# Patient Record
Sex: Male | Born: 1940
Health system: Southern US, Community
[De-identification: ages and names within clinical notes are randomized; demographics above are authoritative.]

## PROBLEM LIST (undated history)

## (undated) DIAGNOSIS — E7800 Pure hypercholesterolemia, unspecified: Secondary | ICD-10-CM

## (undated) DIAGNOSIS — K219 Gastro-esophageal reflux disease without esophagitis: Secondary | ICD-10-CM

## (undated) DIAGNOSIS — E119 Type 2 diabetes mellitus without complications: Secondary | ICD-10-CM

## (undated) DIAGNOSIS — C61 Malignant neoplasm of prostate: Secondary | ICD-10-CM

## (undated) DIAGNOSIS — I10 Essential (primary) hypertension: Secondary | ICD-10-CM

## (undated) DIAGNOSIS — J189 Pneumonia, unspecified organism: Secondary | ICD-10-CM

## (undated) DIAGNOSIS — M1990 Unspecified osteoarthritis, unspecified site: Secondary | ICD-10-CM

## (undated) DIAGNOSIS — I214 Non-ST elevation (NSTEMI) myocardial infarction: Secondary | ICD-10-CM

## (undated) DIAGNOSIS — C4491 Basal cell carcinoma of skin, unspecified: Secondary | ICD-10-CM

## (undated) HISTORY — PX: PROSTATE BIOPSY: SHX241

## (undated) HISTORY — PX: WISDOM TOOTH EXTRACTION: SHX21

## (undated) HISTORY — PX: BASAL CELL CARCINOMA EXCISION: SHX1214

## (undated) HISTORY — PX: TONSILLECTOMY: SUR1361

## (undated) HISTORY — PX: COLONOSCOPY W/ POLYPECTOMY: SHX1380

## (undated) HISTORY — PX: SHOULDER ARTHROSCOPY W/ ROTATOR CUFF REPAIR: SHX2400

---

## 1961-11-13 HISTORY — PX: ORIF FINGER / THUMB FRACTURE: SUR932

## 1998-12-28 ENCOUNTER — Emergency Department (HOSPITAL_COMMUNITY): Admission: EM | Admit: 1998-12-28 | Discharge: 1998-12-28 | Payer: Self-pay | Admitting: Emergency Medicine

## 2005-09-13 ENCOUNTER — Ambulatory Visit (HOSPITAL_COMMUNITY): Admission: RE | Admit: 2005-09-13 | Discharge: 2005-09-13 | Payer: Self-pay | Admitting: Gastroenterology

## 2013-09-10 ENCOUNTER — Other Ambulatory Visit: Payer: Self-pay | Admitting: Internal Medicine

## 2013-09-10 DIAGNOSIS — Z136 Encounter for screening for cardiovascular disorders: Secondary | ICD-10-CM

## 2013-09-24 ENCOUNTER — Ambulatory Visit: Payer: Self-pay

## 2014-10-23 ENCOUNTER — Other Ambulatory Visit (HOSPITAL_COMMUNITY): Payer: Self-pay | Admitting: Urology

## 2014-10-23 DIAGNOSIS — R972 Elevated prostate specific antigen [PSA]: Secondary | ICD-10-CM

## 2014-11-11 ENCOUNTER — Ambulatory Visit (HOSPITAL_COMMUNITY): Admission: RE | Admit: 2014-11-11 | Payer: Federal, State, Local not specified - PPO | Source: Ambulatory Visit

## 2014-11-26 ENCOUNTER — Ambulatory Visit (HOSPITAL_COMMUNITY): Admission: RE | Admit: 2014-11-26 | Payer: Federal, State, Local not specified - PPO | Source: Ambulatory Visit

## 2014-12-11 ENCOUNTER — Ambulatory Visit (HOSPITAL_COMMUNITY)
Admission: RE | Admit: 2014-12-11 | Discharge: 2014-12-11 | Disposition: A | Discharge status: Home / Self Care | Payer: Federal, State, Local not specified - PPO | Source: Ambulatory Visit | Attending: Urology | Admitting: Urology

## 2014-12-11 DIAGNOSIS — N400 Enlarged prostate without lower urinary tract symptoms: Secondary | ICD-10-CM | POA: Diagnosis not present

## 2014-12-11 DIAGNOSIS — R972 Elevated prostate specific antigen [PSA]: Secondary | ICD-10-CM | POA: Diagnosis not present

## 2014-12-11 LAB — POCT I-STAT CREATININE: Creatinine, Ser: 1.1 mg/dL (ref 0.50–1.35)

## 2014-12-11 MED ORDER — GADOBENATE DIMEGLUMINE 529 MG/ML IV SOLN
18.0000 mL | Freq: Once | INTRAVENOUS | Status: AC | PRN
  Administered 2014-12-11: 18 mL via INTRAVENOUS

## 2015-10-14 ENCOUNTER — Other Ambulatory Visit: Payer: Self-pay | Admitting: Gastroenterology

## 2015-12-10 ENCOUNTER — Encounter (HOSPITAL_COMMUNITY): Payer: Self-pay | Admitting: *Deleted

## 2015-12-21 ENCOUNTER — Encounter (HOSPITAL_COMMUNITY)
Admission: RE | Disposition: A | Discharge status: Home / Self Care | Payer: Self-pay | Source: Ambulatory Visit | Attending: Gastroenterology

## 2015-12-21 ENCOUNTER — Ambulatory Visit (HOSPITAL_COMMUNITY): Payer: Federal, State, Local not specified - PPO | Admitting: Anesthesiology

## 2015-12-21 ENCOUNTER — Ambulatory Visit (HOSPITAL_COMMUNITY)
Admission: RE | Admit: 2015-12-21 | Discharge: 2015-12-21 | Disposition: A | Discharge status: Home / Self Care | Payer: Federal, State, Local not specified - PPO | Source: Ambulatory Visit | Attending: Gastroenterology | Admitting: Gastroenterology

## 2015-12-21 ENCOUNTER — Encounter (HOSPITAL_COMMUNITY): Payer: Self-pay | Admitting: *Deleted

## 2015-12-21 DIAGNOSIS — E119 Type 2 diabetes mellitus without complications: Secondary | ICD-10-CM | POA: Insufficient documentation

## 2015-12-21 DIAGNOSIS — K635 Polyp of colon: Secondary | ICD-10-CM | POA: Insufficient documentation

## 2015-12-21 DIAGNOSIS — Z1211 Encounter for screening for malignant neoplasm of colon: Secondary | ICD-10-CM | POA: Insufficient documentation

## 2015-12-21 DIAGNOSIS — Z87891 Personal history of nicotine dependence: Secondary | ICD-10-CM | POA: Insufficient documentation

## 2015-12-21 DIAGNOSIS — C61 Malignant neoplasm of prostate: Secondary | ICD-10-CM | POA: Insufficient documentation

## 2015-12-21 DIAGNOSIS — K5730 Diverticulosis of large intestine without perforation or abscess without bleeding: Secondary | ICD-10-CM | POA: Diagnosis not present

## 2015-12-21 DIAGNOSIS — Z7984 Long term (current) use of oral hypoglycemic drugs: Secondary | ICD-10-CM | POA: Diagnosis not present

## 2015-12-21 DIAGNOSIS — E7800 Pure hypercholesterolemia, unspecified: Secondary | ICD-10-CM | POA: Insufficient documentation

## 2015-12-21 DIAGNOSIS — I10 Essential (primary) hypertension: Secondary | ICD-10-CM | POA: Diagnosis not present

## 2015-12-21 DIAGNOSIS — K621 Rectal polyp: Secondary | ICD-10-CM | POA: Diagnosis not present

## 2015-12-21 HISTORY — PX: COLONOSCOPY WITH PROPOFOL: SHX5780

## 2015-12-21 HISTORY — DX: Essential (primary) hypertension: I10

## 2015-12-21 LAB — GLUCOSE, CAPILLARY: GLUCOSE-CAPILLARY: 117 mg/dL — AB (ref 65–99)

## 2015-12-21 SURGERY — COLONOSCOPY WITH PROPOFOL
Anesthesia: Monitor Anesthesia Care

## 2015-12-21 MED ORDER — PROPOFOL 500 MG/50ML IV EMUL
INTRAVENOUS | Status: DC | PRN
  Administered 2015-12-21: 100 ug/kg/min via INTRAVENOUS

## 2015-12-21 MED ORDER — PROPOFOL 10 MG/ML IV BOLUS
INTRAVENOUS | Status: AC
  Filled 2015-12-21: qty 40

## 2015-12-21 MED ORDER — SODIUM CHLORIDE 0.9 % IV SOLN: INTRAVENOUS | Status: DC

## 2015-12-21 MED ORDER — PROPOFOL 500 MG/50ML IV EMUL
INTRAVENOUS | Status: DC | PRN
  Administered 2015-12-21: 50 mg via INTRAVENOUS

## 2015-12-21 MED ORDER — LACTATED RINGERS IV SOLN
INTRAVENOUS | Status: DC
  Administered 2015-12-21: 1000 mL via INTRAVENOUS

## 2015-12-21 SURGICAL SUPPLY — 22 items

## 2015-12-21 NOTE — Discharge Instructions (Signed)

## 2015-12-21 NOTE — Transfer of Care (Signed)
Immediate Anesthesia Transfer of Care Note  Patient: David Drake  Procedure(s) Performed: Procedure(s): COLONOSCOPY WITH PROPOFOL (N/A)  Patient Location: PACU  Anesthesia Type:MAC  Level of Consciousness: awake, alert  and oriented  Airway & Oxygen Therapy: Patient Spontanous Breathing and Patient connected to face mask oxygen  Post-op Assessment: Report given to RN and Post -op Vital signs reviewed and stable  Post vital signs: Reviewed and stable  Last Vitals:  Filed Vitals:   12/21/15 0827  BP: 166/85  Pulse: 81  Temp: 36.6 C  Resp: 21    Complications: No apparent anesthesia complications

## 2015-12-21 NOTE — Anesthesia Preprocedure Evaluation (Addendum)
Anesthesia Evaluation  Patient identified by MRN, date of birth, ID band Patient awake    Reviewed: Allergy & Precautions, NPO status , Patient's Chart, lab work & pertinent test results  Airway Mallampati: I  TM Distance: >3 FB     Dental   Pulmonary former smoker,    Pulmonary exam normal        Cardiovascular hypertension, Normal cardiovascular exam     Neuro/Psych    GI/Hepatic   Endo/Other  diabetes, Type 2, Oral Hypoglycemic Agents  Renal/GU      Musculoskeletal   Abdominal   Peds  Hematology   Anesthesia Other Findings   Reproductive/Obstetrics                            Anesthesia Physical Anesthesia Plan  ASA: II  Anesthesia Plan: MAC   Post-op Pain Management:    Induction: Intravenous  Airway Management Planned: Mask  Additional Equipment:   Intra-op Plan:   Post-operative Plan:   Informed Consent: I have reviewed the patients History and Physical, chart, labs and discussed the procedure including the risks, benefits and alternatives for the proposed anesthesia with the patient or authorized representative who has indicated his/her understanding and acceptance.     Plan Discussed with: CRNA, Anesthesiologist and Surgeon  Anesthesia Plan Comments:         Anesthesia Quick Evaluation

## 2015-12-21 NOTE — Op Note (Signed)
Procedure: Screening colonoscopy. Normal screening colonoscopy performed on 09/13/2005  Endoscopist: Earle Gell  Premedication: Propofol administered by anesthesia  Procedure: The patient was placed in the left lateral decubitus position. Anal inspection and digital rectal exam were normal. The patient has low-grade prostate cancer. The Pentax pediatric colonoscope was introduced into the rectum and advanced to the cecum. A normal-appearing appendiceal orifice was identified. A normal-appearing ileocecal valve was intubated and the terminal ileum inspected. Colonic preparation for the exam today was good. Withdrawal time was 18 minutes  Rectum. Four 3 mm sized hyperplastic-appearing polyps were removed with the cold snare. Retroflexed view of the distal rectum was normal  Sigmoid colon and descending colon. Colonic diverticulosis  Splenic flexure. Normal  Transverse colon. Normal  Hepatic flexure. Normal  Ascending colon. Normal  Cecum and ileocecal valve. A 5 mm sessile serrated appearing polyp was removed with the cold snare  Terminal ileum. Normal  Assessment: Four diminutive hyperplastic-appearing polyps were removed from the rectum and a small sessile serrated appearing polyp was removed from the cecum. Otherwise normal colonoscopy.

## 2015-12-21 NOTE — Anesthesia Postprocedure Evaluation (Signed)
Anesthesia Post Note  Patient: David Drake  Procedure(s) Performed: Procedure(s) (LRB): COLONOSCOPY WITH PROPOFOL (N/A)  Patient location during evaluation: Endoscopy Anesthesia Type: MAC Level of consciousness: awake, awake and alert, oriented and patient cooperative Pain management: pain level controlled Vital Signs Assessment: post-procedure vital signs reviewed and stable Respiratory status: spontaneous breathing and respiratory function stable Cardiovascular status: blood pressure returned to baseline and stable Anesthetic complications: no    Last Vitals:  Filed Vitals:   12/21/15 1020 12/21/15 1030  BP: 131/67 131/78  Pulse: 60 62  Temp:    Resp: 12 17    Last Pain: There were no vitals filed for this visit.               [name] EDWARD

## 2015-12-21 NOTE — H&P (Signed)
  Procedure: Screening colonoscopy. Normal screening colonoscopy performed on 09/13/2005  History: The patient is a 75 year old male born 12/21/1940. He is scheduled to undergo a repeat screening colonoscopy today.  Past medical history: Prostate biopsy. Left shoulder rotator cuff surgery. Tonsillectomy. Hypertension. Prostate cancer. Hypercholesterolemia. Cervical degenerative disc disease. Type 2 diabetes mellitus.  Medication allergies: None  Exam: The patient is alert and lying comfortably on the endoscopy stretcher. Abdomen is soft and nontender to palpation. Cardiac exam reveals a regular rhythm. Lungs are clear to auscultation.  Plan: Proceed with repeat colonoscopy

## 2015-12-22 ENCOUNTER — Encounter (HOSPITAL_COMMUNITY): Payer: Self-pay | Admitting: Gastroenterology

## 2018-10-28 ENCOUNTER — Encounter (HOSPITAL_COMMUNITY): Payer: Self-pay

## 2018-10-28 ENCOUNTER — Inpatient Hospital Stay (HOSPITAL_COMMUNITY)
Admission: EM | Admit: 2018-10-28 | Discharge: 2018-10-30 | DRG: 247 | Disposition: A | Discharge status: Home / Self Care | Payer: Medicare Other | Attending: Internal Medicine | Admitting: Internal Medicine

## 2018-10-28 ENCOUNTER — Other Ambulatory Visit: Payer: Self-pay

## 2018-10-28 ENCOUNTER — Emergency Department (HOSPITAL_COMMUNITY): Payer: Medicare Other

## 2018-10-28 DIAGNOSIS — Z8546 Personal history of malignant neoplasm of prostate: Secondary | ICD-10-CM

## 2018-10-28 DIAGNOSIS — Z9861 Coronary angioplasty status: Secondary | ICD-10-CM

## 2018-10-28 DIAGNOSIS — I2510 Atherosclerotic heart disease of native coronary artery without angina pectoris: Secondary | ICD-10-CM

## 2018-10-28 DIAGNOSIS — N179 Acute kidney failure, unspecified: Secondary | ICD-10-CM | POA: Diagnosis present

## 2018-10-28 DIAGNOSIS — I252 Old myocardial infarction: Secondary | ICD-10-CM | POA: Diagnosis present

## 2018-10-28 DIAGNOSIS — I25110 Atherosclerotic heart disease of native coronary artery with unstable angina pectoris: Secondary | ICD-10-CM | POA: Diagnosis present

## 2018-10-28 DIAGNOSIS — I214 Non-ST elevation (NSTEMI) myocardial infarction: Secondary | ICD-10-CM

## 2018-10-28 DIAGNOSIS — Z955 Presence of coronary angioplasty implant and graft: Secondary | ICD-10-CM

## 2018-10-28 DIAGNOSIS — E8881 Metabolic syndrome: Secondary | ICD-10-CM | POA: Diagnosis present

## 2018-10-28 DIAGNOSIS — R079 Chest pain, unspecified: Secondary | ICD-10-CM | POA: Diagnosis not present

## 2018-10-28 DIAGNOSIS — Z87891 Personal history of nicotine dependence: Secondary | ICD-10-CM

## 2018-10-28 DIAGNOSIS — E1122 Type 2 diabetes mellitus with diabetic chronic kidney disease: Secondary | ICD-10-CM | POA: Diagnosis present

## 2018-10-28 DIAGNOSIS — E785 Hyperlipidemia, unspecified: Secondary | ICD-10-CM | POA: Diagnosis present

## 2018-10-28 DIAGNOSIS — I10 Essential (primary) hypertension: Secondary | ICD-10-CM

## 2018-10-28 DIAGNOSIS — I129 Hypertensive chronic kidney disease with stage 1 through stage 4 chronic kidney disease, or unspecified chronic kidney disease: Secondary | ICD-10-CM | POA: Diagnosis present

## 2018-10-28 DIAGNOSIS — E119 Type 2 diabetes mellitus without complications: Secondary | ICD-10-CM

## 2018-10-28 DIAGNOSIS — N183 Chronic kidney disease, stage 3 (moderate): Secondary | ICD-10-CM | POA: Diagnosis present

## 2018-10-28 HISTORY — DX: Type 2 diabetes mellitus without complications: E11.9

## 2018-10-28 HISTORY — DX: Unspecified osteoarthritis, unspecified site: M19.90

## 2018-10-28 HISTORY — DX: Non-ST elevation (NSTEMI) myocardial infarction: I21.4

## 2018-10-28 HISTORY — DX: Gastro-esophageal reflux disease without esophagitis: K21.9

## 2018-10-28 HISTORY — DX: Basal cell carcinoma of skin, unspecified: C44.91

## 2018-10-28 HISTORY — DX: Pure hypercholesterolemia, unspecified: E78.00

## 2018-10-28 HISTORY — DX: Malignant neoplasm of prostate: C61

## 2018-10-28 HISTORY — DX: Pneumonia, unspecified organism: J18.9

## 2018-10-28 LAB — BASIC METABOLIC PANEL
ANION GAP: 11 (ref 5–15)
BUN: 17 mg/dL (ref 8–23)
CO2: 26 mmol/L (ref 22–32)
Calcium: 9.7 mg/dL (ref 8.9–10.3)
Chloride: 107 mmol/L (ref 98–111)
Creatinine, Ser: 1.28 mg/dL — ABNORMAL HIGH (ref 0.61–1.24)
GFR calc Af Amer: >60 mL/min (ref >60)
GFR calc non Af Amer: 54 mL/min — ABNORMAL LOW (ref >60)
Glucose, Bld: 89 mg/dL (ref 70–99)
Potassium: 4.6 mmol/L (ref 3.5–5.1)
Sodium: 144 mmol/L (ref 135–145)

## 2018-10-28 LAB — I-STAT TROPONIN, ED: Troponin i, poc: 0.06 ng/mL (ref 0.00–0.08)

## 2018-10-28 LAB — CBC
HEMATOCRIT: 43.9 % (ref 39.0–52.0)
Hemoglobin: 13.5 g/dL (ref 13.0–17.0)
MCH: 30 pg (ref 26.0–34.0)
MCHC: 30.8 g/dL (ref 30.0–36.0)
MCV: 97.6 fL (ref 80.0–100.0)
Platelets: 283 10*3/uL (ref 150–400)
RBC: 4.5 MIL/uL (ref 4.22–5.81)
RDW: 11.4 % — ABNORMAL LOW (ref 11.5–15.5)
WBC: 7.7 10*3/uL (ref 4.0–10.5)
nRBC: 0 % (ref 0.0–0.2)

## 2018-10-28 NOTE — ED Triage Notes (Signed)
Pt c.o central non radiating cp that has been intermittent or 2 weeks. Gets worse upon exertion. Seen at PCP today and was sent here for further workup. Pt denies pain at this time. Denies SOB/nausea. nad noted in triage

## 2018-10-29 ENCOUNTER — Inpatient Hospital Stay (HOSPITAL_COMMUNITY): Payer: Medicare Other

## 2018-10-29 ENCOUNTER — Encounter (HOSPITAL_COMMUNITY): Payer: Self-pay | Admitting: *Deleted

## 2018-10-29 ENCOUNTER — Encounter (HOSPITAL_COMMUNITY)
Admission: EM | Disposition: A | Discharge status: Home / Self Care | Payer: Self-pay | Source: Home / Self Care | Attending: Internal Medicine

## 2018-10-29 DIAGNOSIS — E785 Hyperlipidemia, unspecified: Secondary | ICD-10-CM | POA: Diagnosis present

## 2018-10-29 DIAGNOSIS — E1122 Type 2 diabetes mellitus with diabetic chronic kidney disease: Secondary | ICD-10-CM | POA: Diagnosis present

## 2018-10-29 DIAGNOSIS — Z8546 Personal history of malignant neoplasm of prostate: Secondary | ICD-10-CM | POA: Diagnosis not present

## 2018-10-29 DIAGNOSIS — I129 Hypertensive chronic kidney disease with stage 1 through stage 4 chronic kidney disease, or unspecified chronic kidney disease: Secondary | ICD-10-CM | POA: Diagnosis present

## 2018-10-29 DIAGNOSIS — Z87891 Personal history of nicotine dependence: Secondary | ICD-10-CM | POA: Diagnosis not present

## 2018-10-29 DIAGNOSIS — E8881 Metabolic syndrome: Secondary | ICD-10-CM | POA: Diagnosis present

## 2018-10-29 DIAGNOSIS — N183 Chronic kidney disease, stage 3 (moderate): Secondary | ICD-10-CM | POA: Diagnosis present

## 2018-10-29 DIAGNOSIS — E7800 Pure hypercholesterolemia, unspecified: Secondary | ICD-10-CM | POA: Diagnosis not present

## 2018-10-29 DIAGNOSIS — I10 Essential (primary) hypertension: Secondary | ICD-10-CM | POA: Diagnosis not present

## 2018-10-29 DIAGNOSIS — I2510 Atherosclerotic heart disease of native coronary artery without angina pectoris: Secondary | ICD-10-CM

## 2018-10-29 DIAGNOSIS — I214 Non-ST elevation (NSTEMI) myocardial infarction: Secondary | ICD-10-CM

## 2018-10-29 DIAGNOSIS — R079 Chest pain, unspecified: Secondary | ICD-10-CM | POA: Diagnosis present

## 2018-10-29 DIAGNOSIS — N179 Acute kidney failure, unspecified: Secondary | ICD-10-CM | POA: Diagnosis present

## 2018-10-29 DIAGNOSIS — I252 Old myocardial infarction: Secondary | ICD-10-CM | POA: Diagnosis present

## 2018-10-29 DIAGNOSIS — I25110 Atherosclerotic heart disease of native coronary artery with unstable angina pectoris: Secondary | ICD-10-CM | POA: Diagnosis present

## 2018-10-29 DIAGNOSIS — I370 Nonrheumatic pulmonary valve stenosis: Secondary | ICD-10-CM | POA: Diagnosis not present

## 2018-10-29 DIAGNOSIS — I361 Nonrheumatic tricuspid (valve) insufficiency: Secondary | ICD-10-CM | POA: Diagnosis not present

## 2018-10-29 HISTORY — DX: Non-ST elevation (NSTEMI) myocardial infarction: I21.4

## 2018-10-29 HISTORY — PX: CORONARY STENT INTERVENTION: CATH118234

## 2018-10-29 HISTORY — PX: CORONARY ANGIOPLASTY WITH STENT PLACEMENT: SHX49

## 2018-10-29 HISTORY — PX: LEFT HEART CATH AND CORONARY ANGIOGRAPHY: CATH118249

## 2018-10-29 LAB — POCT ACTIVATED CLOTTING TIME
Activated Clotting Time: 313 seconds
Activated Clotting Time: 274 seconds
Activated Clotting Time: 362 seconds
Activated Clotting Time: 621 seconds
Activated Clotting Time: 654 seconds

## 2018-10-29 LAB — LIPID PANEL
CHOL/HDL RATIO: 2.9 ratio
Cholesterol: 135 mg/dL (ref 0–200)
HDL: 46 mg/dL (ref >40)
LDL CALC: 81 mg/dL (ref 0–99)
Triglycerides: 40 mg/dL (ref <150)
VLDL: 8 mg/dL (ref 0–40)

## 2018-10-29 LAB — TROPONIN I: Troponin I: 0.08 ng/mL (ref <0.03)

## 2018-10-29 LAB — GLUCOSE, CAPILLARY
Glucose-Capillary: 97 mg/dL (ref 70–99)
Glucose-Capillary: 152 mg/dL — ABNORMAL HIGH (ref 70–99)

## 2018-10-29 LAB — HEMOGLOBIN A1C
Hgb A1c MFr Bld: 6.3 % — ABNORMAL HIGH (ref 4.8–5.6)
Mean Plasma Glucose: 134.11 mg/dL

## 2018-10-29 LAB — HEPARIN LEVEL (UNFRACTIONATED): Heparin Unfractionated: 0.88 IU/mL — ABNORMAL HIGH (ref 0.30–0.70)

## 2018-10-29 SURGERY — LEFT HEART CATH AND CORONARY ANGIOGRAPHY
Anesthesia: LOCAL

## 2018-10-29 MED ORDER — ASPIRIN 81 MG PO CHEW: 81.0000 mg | CHEWABLE_TABLET | ORAL | Status: DC

## 2018-10-29 MED ORDER — TICAGRELOR 90 MG PO TABS
ORAL_TABLET | ORAL | Status: DC | PRN
  Administered 2018-10-29: 180 mg via ORAL

## 2018-10-29 MED ORDER — LABETALOL HCL 5 MG/ML IV SOLN: 10.0000 mg | INTRAVENOUS | Status: AC | PRN

## 2018-10-29 MED ORDER — HYDRALAZINE HCL 20 MG/ML IJ SOLN: 5.0000 mg | INTRAMUSCULAR | Status: AC | PRN

## 2018-10-29 MED ORDER — SODIUM CHLORIDE 0.9% FLUSH: 3.0000 mL | Freq: Two times a day (BID) | INTRAVENOUS | Status: DC

## 2018-10-29 MED ORDER — VITAMIN D 25 MCG (1000 UNIT) PO TABS
1000.0000 [IU] | ORAL_TABLET | Freq: Every day | ORAL | Status: DC
  Administered 2018-10-30: 1000 [IU] via ORAL
  Filled 2018-10-29: qty 1

## 2018-10-29 MED ORDER — FENTANYL CITRATE (PF) 100 MCG/2ML IJ SOLN
INTRAMUSCULAR | Status: DC | PRN
  Administered 2018-10-29: 25 ug via INTRAVENOUS

## 2018-10-29 MED ORDER — TICAGRELOR 90 MG PO TABS
90.0000 mg | ORAL_TABLET | Freq: Two times a day (BID) | ORAL | Status: DC
  Administered 2018-10-30 (×2): 90 mg via ORAL
  Filled 2018-10-29 (×2): qty 1

## 2018-10-29 MED ORDER — SODIUM CHLORIDE 0.9 % IV SOLN: 250.0000 mL | INTRAVENOUS | Status: DC | PRN

## 2018-10-29 MED ORDER — GLIPIZIDE ER 2.5 MG PO TB24
2.5000 mg | ORAL_TABLET | Freq: Every day | ORAL | Status: DC
  Administered 2018-10-30: 08:00:00 2.5 mg via ORAL
  Filled 2018-10-29: qty 1

## 2018-10-29 MED ORDER — HEPARIN SODIUM (PORCINE) 1000 UNIT/ML IJ SOLN
INTRAMUSCULAR | Status: DC | PRN
  Administered 2018-10-29: 3000 [IU] via INTRAVENOUS
  Administered 2018-10-29: 4000 [IU] via INTRAVENOUS
  Administered 2018-10-29: 6000 [IU] via INTRAVENOUS

## 2018-10-29 MED ORDER — ATORVASTATIN CALCIUM 80 MG PO TABS
80.0000 mg | ORAL_TABLET | Freq: Every day | ORAL | Status: DC
  Administered 2018-10-29: 80 mg via ORAL
  Filled 2018-10-29: qty 1

## 2018-10-29 MED ORDER — ASPIRIN EC 81 MG PO TBEC
81.0000 mg | DELAYED_RELEASE_TABLET | Freq: Every day | ORAL | Status: DC
  Administered 2018-10-30: 09:00:00 81 mg via ORAL
  Filled 2018-10-29: qty 1

## 2018-10-29 MED ORDER — ATORVASTATIN CALCIUM 80 MG PO TABS: 80.0000 mg | ORAL_TABLET | Freq: Every day | ORAL | Status: DC

## 2018-10-29 MED ORDER — MIDAZOLAM HCL 2 MG/2ML IJ SOLN
INTRAMUSCULAR | Status: DC | PRN
  Administered 2018-10-29: 1 mg via INTRAVENOUS

## 2018-10-29 MED ORDER — HEART ATTACK BOUNCING BOOK
Freq: Once | Status: AC
  Administered 2018-10-29: 22:00:00
  Filled 2018-10-29: qty 1

## 2018-10-29 MED ORDER — NITROGLYCERIN 1 MG/10 ML FOR IR/CATH LAB
INTRA_ARTERIAL | Status: DC | PRN
  Administered 2018-10-29 (×2): 200 ug via INTRACORONARY

## 2018-10-29 MED ORDER — ANGIOPLASTY BOOK
Freq: Once | Status: AC
  Administered 2018-10-29: 22:00:00
  Filled 2018-10-29: qty 1

## 2018-10-29 MED ORDER — SODIUM CHLORIDE 0.9% FLUSH: 3.0000 mL | INTRAVENOUS | Status: DC | PRN

## 2018-10-29 MED ORDER — TICAGRELOR 90 MG PO TABS
ORAL_TABLET | ORAL | Status: AC
  Filled 2018-10-29: qty 2

## 2018-10-29 MED ORDER — IOHEXOL 350 MG/ML SOLN
INTRAVENOUS | Status: DC | PRN
  Administered 2018-10-29: 310 mL via INTRA_ARTERIAL

## 2018-10-29 MED ORDER — VITAMIN C 500 MG PO TABS
1000.0000 mg | ORAL_TABLET | Freq: Every day | ORAL | Status: DC
  Administered 2018-10-30: 1000 mg via ORAL
  Filled 2018-10-29: qty 2

## 2018-10-29 MED ORDER — FENTANYL CITRATE (PF) 100 MCG/2ML IJ SOLN
INTRAMUSCULAR | Status: AC
  Filled 2018-10-29: qty 2

## 2018-10-29 MED ORDER — HEPARIN BOLUS VIA INFUSION
4000.0000 [IU] | Freq: Once | INTRAVENOUS | Status: AC
  Administered 2018-10-29: 4000 [IU] via INTRAVENOUS
  Filled 2018-10-29: qty 4000

## 2018-10-29 MED ORDER — VERAPAMIL HCL 2.5 MG/ML IV SOLN
INTRAVENOUS | Status: AC
  Filled 2018-10-29: qty 2

## 2018-10-29 MED ORDER — LIDOCAINE HCL (PF) 1 % IJ SOLN
INTRAMUSCULAR | Status: AC
  Filled 2018-10-29: qty 30

## 2018-10-29 MED ORDER — NITROGLYCERIN 1 MG/10 ML FOR IR/CATH LAB
INTRA_ARTERIAL | Status: AC
  Filled 2018-10-29: qty 10

## 2018-10-29 MED ORDER — HEPARIN (PORCINE) IN NACL 1000-0.9 UT/500ML-% IV SOLN
INTRAVENOUS | Status: AC
  Filled 2018-10-29: qty 1000

## 2018-10-29 MED ORDER — ASPIRIN 81 MG PO CHEW
324.0000 mg | CHEWABLE_TABLET | Freq: Once | ORAL | Status: AC
  Administered 2018-10-29: 324 mg via ORAL
  Filled 2018-10-29: qty 4

## 2018-10-29 MED ORDER — SODIUM CHLORIDE 0.9 % IV SOLN: INTRAVENOUS | Status: DC

## 2018-10-29 MED ORDER — VERAPAMIL HCL 2.5 MG/ML IV SOLN
INTRAVENOUS | Status: DC | PRN
  Administered 2018-10-29: 14:00:00 via INTRA_ARTERIAL

## 2018-10-29 MED ORDER — MIDAZOLAM HCL 2 MG/2ML IJ SOLN
INTRAMUSCULAR | Status: AC
  Filled 2018-10-29: qty 2

## 2018-10-29 MED ORDER — HEPARIN SODIUM (PORCINE) 1000 UNIT/ML IJ SOLN
INTRAMUSCULAR | Status: AC
  Filled 2018-10-29: qty 1

## 2018-10-29 MED ORDER — HEPARIN (PORCINE) IN NACL 1000-0.9 UT/500ML-% IV SOLN
INTRAVENOUS | Status: DC | PRN
  Administered 2018-10-29 (×3): 500 mL

## 2018-10-29 MED ORDER — HEPARIN (PORCINE) 25000 UT/250ML-% IV SOLN
950.0000 [IU]/h | INTRAVENOUS | Status: DC
  Administered 2018-10-29: 1100 [IU]/h via INTRAVENOUS
  Filled 2018-10-29: qty 250

## 2018-10-29 MED ORDER — METOPROLOL TARTRATE 25 MG PO TABS
25.0000 mg | ORAL_TABLET | Freq: Two times a day (BID) | ORAL | Status: DC
  Administered 2018-10-29 – 2018-10-30 (×2): 25 mg via ORAL
  Filled 2018-10-29 (×2): qty 1

## 2018-10-29 MED ORDER — SODIUM CHLORIDE 0.9 % IV SOLN
INTRAVENOUS | Status: AC
  Administered 2018-10-29: 17:00:00 via INTRAVENOUS

## 2018-10-29 MED ORDER — HEPARIN (PORCINE) IN NACL 1000-0.9 UT/500ML-% IV SOLN
INTRAVENOUS | Status: AC
  Filled 2018-10-29: qty 500

## 2018-10-29 SURGICAL SUPPLY — 26 items
BALLN SAPPHIRE 2.0X12 (BALLOONS) ×2
BALLN SAPPHIRE 2.5X20 (BALLOONS) ×2
BALLN SAPPHIRE ~~LOC~~ 2.5X8 (BALLOONS) ×1 IMPLANT
BALLN ~~LOC~~ EMERGE MR 3.25X20 (BALLOONS) ×2
BALLOON SAPPHIRE 2.0X12 (BALLOONS) IMPLANT
BALLOON SAPPHIRE 2.5X20 (BALLOONS) IMPLANT
BALLOON ~~LOC~~ EMERGE MR 3.25X20 (BALLOONS) IMPLANT
CATH 5FR JL3.5 JR4 ANG PIG MP (CATHETERS) ×1 IMPLANT
CATH LAUNCHER 6FR AL1 (CATHETERS) IMPLANT
CATH VISTA GUIDE 6FR XBLAD3.5 (CATHETERS) ×1 IMPLANT
CATHETER LAUNCHER 6FR AL1 (CATHETERS) ×2
DEVICE RAD COMP TR BAND LRG (VASCULAR PRODUCTS) ×1 IMPLANT
GLIDESHEATH SLEND SS 6F .021 (SHEATH) ×1 IMPLANT
GUIDEWIRE INQWIRE 1.5J.035X260 (WIRE) IMPLANT
INQWIRE 1.5J .035X260CM (WIRE) ×2
KIT ENCORE 26 ADVANTAGE (KITS) ×1 IMPLANT
KIT HEART LEFT (KITS) ×2 IMPLANT
PACK CARDIAC CATHETERIZATION (CUSTOM PROCEDURE TRAY) ×2 IMPLANT
STENT SYNERGY DES 2.25X12 (Permanent Stent) ×1 IMPLANT
STENT SYNERGY DES 2.5X16 (Permanent Stent) ×1 IMPLANT
STENT SYNERGY DES 3X32 (Permanent Stent) ×1 IMPLANT
SYR MEDRAD MARK 7 150ML (SYRINGE) ×2 IMPLANT
TRANSDUCER W/STOPCOCK (MISCELLANEOUS) ×2 IMPLANT
TUBING CIL FLEX 10 FLL-RA (TUBING) ×2 IMPLANT
WIRE COUGAR XT STRL 190CM (WIRE) ×2 IMPLANT
WIRE HI TORQ WHISPER MS 190CM (WIRE) ×1 IMPLANT

## 2018-10-29 NOTE — ED Triage Notes (Signed)
PT NPO for Cardiac cath today as an add on. Family updated on plan for Cath.

## 2018-10-29 NOTE — Progress Notes (Signed)
ANTICOAGULATION CONSULT NOTE - Initial Consult  Pharmacy Consult for heparin Indication: chest pain/ACS  No Known Allergies  Patient Measurements: Height: 5\' 6"  (167.6 cm) Weight: 186 lb (84.4 kg) IBW/kg (Calculated) : 63.8 Heparin Dosing Weight: 80kg  Vital Signs: Temp: 98.2 F (36.8 C) (12/17 0128) Temp Source: Oral (12/17 0128) BP: 98/50 (12/17 1300) Pulse Rate: 55 (12/17 1300)  Labs: Recent Labs    10/28/18 2005 10/29/18 0232 10/29/18 1200  HGB 13.5  --   --   HCT 43.9  --   --   PLT 283  --   --   HEPARINUNFRC  --   --  0.88*  CREATININE 1.28*  --   --   TROPONINI  --  0.08*  --     Estimated Creatinine Clearance: 49.2 mL/min (A) (by C-G formula based on SCr of 1.28 mg/dL (H)).   Medical History: Past Medical History:  Diagnosis Date  . Cancer [facility])    Prostate Cancer- low grade."watchful waiting being observed" -[facility]  . Diabetes mellitus without complication ([place])    T1-4 yrs on oral med  . Hypertension     Assessment: 77yo male c/o intermittent CP w/ exertion, found to have NSTEMI. Scheduled for cardiac cath today.  Initial heparin level 0.88, above goal range. CBC WNL   Goal of Therapy:  Heparin level 0.3-0.7 units/ml Monitor platelets by anticoagulation protocol: Yes   Plan:  Decrease heparin to 950 units/hr Check 8 hour heparin level Daily heparin levels and CBC.  Harrietta Guardian, PharmD PGY1 Pharmacy Resident 10/29/2018    1:20 PM Please check AMION for all [place] numbers

## 2018-10-29 NOTE — ED Provider Notes (Signed)
[facility] EMERGENCY DEPARTMENT Provider Note   CSN: [id number] Arrival date & time: 10/28/18  1941     History   Chief Complaint Chief Complaint  Patient presents with  . Chest Pain    HPI David Drake is a 77 y.o. male.  Patient with history of hypertension, diabetes, prostate cancer presenting from urgent care with a 2-week history of intermittent exertional chest pain.  Last episode was at 4 PM today while carrying a box of clothing.  Describes pain in the center of his chest that lasted about 10 minutes.  Did not radiate.  No associated shortness of breath, nausea or vomiting.  Over the weekend he had several episodes of the pain that was exertional and had one episode of nausea and vomiting 2 days ago.  He denies any chest pain at this time.  Reports having a negative stress test many years ago but not recently.  No leg pain or leg swelling.  No abdominal pain, fever, chills, urinary symptoms.  The history is provided by the patient and the spouse.  Chest Pain   Associated symptoms include nausea, shortness of breath and vomiting. Pertinent negatives include no abdominal pain, no dizziness, no fever, no headaches and no weakness.  Pertinent negatives for past medical history include no seizures.    Past Medical History:  Diagnosis Date  . Cancer [facility])    Prostate Cancer- low grade."watchful waiting being observed" -[facility]  . Diabetes mellitus without complication ([place])    F7-5 yrs on oral med  . Hypertension     There are no active problems to display for this patient.   Past Surgical History:  Procedure Laterality Date  . COLONOSCOPY W/ POLYPECTOMY    . COLONOSCOPY WITH PROPOFOL N/A 12/21/2015   Procedure: COLONOSCOPY WITH PROPOFOL;  Surgeon: Garlan Fair, MD;  Location: [facility] ENDOSCOPY;  Service: Endoscopy;  Laterality: N/A;  . ORIF FINGER / THUMB FRACTURE Right    no retained hardware  . TONSILLECTOMY          Home Medications     Prior to Admission medications   Medication Sig Start Date End Date Taking? Authorizing Provider  Ascorbic Acid (VITAMIN C) 1000 MG tablet Take 1,000 mg by mouth daily.    [provider]  atorvastatin (LIPITOR) 10 MG tablet Take 10 mg by mouth daily.    [provider]  cholecalciferol (VITAMIN D) 1000 units tablet Take 1,000 Units by mouth daily.    [provider]  glipiZIDE (GLUCOTROL XL) 2.5 MG 24 hr tablet Take 2.5 mg by mouth daily with breakfast.    [provider]  valsartan (DIOVAN) 80 MG tablet Take 80 mg by mouth every morning.    [provider]    Family History No family history on file.  Social History Social History   Tobacco Use  . Smoking status: Former Smoker    Years: 20.00    Types: Cigarettes    Last attempt to quit: 12/09/1978    Years since quitting: 39.9  Substance Use Topics  . Alcohol use: Yes    Comment: 1-2 wine  . Drug use: No     Allergies   Patient has no known allergies.   Review of Systems Review of Systems  Constitutional: Negative for activity change, appetite change and fever.  Respiratory: Positive for chest tightness and shortness of breath.   Cardiovascular: Positive for chest pain.  Gastrointestinal: Positive for nausea and vomiting. Negative for abdominal  pain.  Genitourinary: Negative for dysuria and testicular pain.  Musculoskeletal: Negative for gait problem.  Neurological: Negative for dizziness, seizures, weakness and headaches.    all other systems are negative except as noted in the HPI and PMH.    Physical Exam Updated Vital Signs BP 128/60 (BP Location: Right Arm)   Pulse 66   Temp 98.2 F (36.8 C) (Oral)   Resp 18   Ht 5\' 6"  (1.676 m)   Wt 84.4 kg   SpO2 98%   BMI 30.02 kg/m   Physical Exam Vitals signs and nursing note reviewed.  Constitutional:      General: He is not in acute distress.    Appearance: He is well-developed.  HENT:     Head:  Normocephalic and atraumatic.     Mouth/Throat:     Pharynx: No oropharyngeal exudate.  Eyes:     Conjunctiva/sclera: Conjunctivae normal.     Pupils: Pupils are equal, round, and reactive to light.  Neck:     Musculoskeletal: Normal range of motion and neck supple.     Comments: No meningismus. Cardiovascular:     Rate and Rhythm: Normal rate and regular rhythm.     Heart sounds: Normal heart sounds. No murmur.  Pulmonary:     Effort: Pulmonary effort is normal. No respiratory distress.     Breath sounds: Normal breath sounds.  Chest:     Chest wall: No tenderness.  Abdominal:     Palpations: Abdomen is soft.     Tenderness: There is no abdominal tenderness. There is no guarding or rebound.  Musculoskeletal: Normal range of motion.        General: No tenderness.  Skin:    General: Skin is warm.  Neurological:     Mental Status: He is alert and oriented to person, place, and time.     Cranial Nerves: No cranial nerve deficit.     Motor: No abnormal muscle tone.     Coordination: Coordination normal.     Comments:  5/5 strength throughout. CN 2-12 intact.Equal grip strength.   Psychiatric:        Behavior: Behavior normal.      ED Treatments / Results  Labs (all labs ordered are listed, but only abnormal results are displayed) Labs Reviewed  BASIC METABOLIC PANEL - Abnormal; Notable for the following components:      Result Value   Creatinine, Ser 1.28 (*)    GFR calc non Af Amer 54 (*)    All other components within normal limits  CBC - Abnormal; Notable for the following components:   RDW 11.4 (*)    All other components within normal limits  TROPONIN I - Abnormal; Notable for the following components:   Troponin I 0.08 (*)    All other components within normal limits  HEPARIN LEVEL (UNFRACTIONATED)  I-STAT TROPONIN, ED    EKG EKG Interpretation  Date/Time:  Monday October 28 2018 20:04:11 EST Ventricular Rate:  75 PR Interval:  140 QRS Duration: 100 QT  Interval:  396 QTC Calculation: 442 R Axis:   -22 Text Interpretation:  Sinus rhythm with Premature atrial complexes Incomplete right bundle branch block Abnormal QRS-T angle, consider primary T wave abnormality Abnormal ECG nonspecific st changes new Confirmed by Merrily Pew [id number]) on 10/29/2018 2:18:02 AM   Radiology Dg Chest 2 View  Result Date: 10/28/2018 CLINICAL DATA:  77 y/o  M; chest pain, nausea, vomiting. EXAM: CHEST - 2 VIEW COMPARISON:  None. FINDINGS: The heart  size and mediastinal contours are within normal limits. No consolidation, effusion, or pneumothorax. No acute osseous abnormality is evident. Mild-to-moderate spondylosis of the thoracic spine. IMPRESSION: No acute pulmonary process identified. Electronically Signed   By: Kristine Garbe M.D.   On: 10/28/2018 20:58    Procedures Procedures (including critical care time)  Medications Ordered in ED Medications - No data to display   Initial Impression / Assessment and Plan / ED Course  I have reviewed the triage vital signs and the nursing notes.  Pertinent labs & imaging results that were available during my care of the patient were reviewed by me and considered in my medical decision making (see chart for details).    Patient here with concerning story for unstable angina and chest pain on exertion.  EKG shows T wave flattening laterally.  Initial troponin is negative.  Patient has waited in the waiting room for more than 6 hours.  Will send second troponin.  Troponin +0.08.  Consistent with an STEMI.  Patient will be started IV heparin.  Low suspicion for pulmonary embolism or aortic dissection.  Patient remains chest pain-free.  Admission discussed with cardiology Dr. Elson Areas.  CRITICAL CARE Performed by: Ezequiel Essex Total critical care time: 35 minutes Critical care time was exclusive of separately billable procedures and treating other patients. Critical care was necessary to treat or  prevent imminent or life-threatening deterioration. Critical care was time spent personally by me on the following activities: development of treatment plan with patient and/or surrogate as well as nursing, discussions with consultants, evaluation of patient's response to treatment, examination of patient, obtaining history from patient or surrogate, ordering and performing treatments and interventions, ordering and review of laboratory studies, ordering and review of radiographic studies, pulse oximetry and re-evaluation of patient's condition.   Final Clinical Impressions(s) / ED Diagnoses   Final diagnoses:  NSTEMI (non-ST elevated myocardial infarction) [facility])    ED Discharge Orders    None       Ezequiel Essex, MD 10/29/18 [phone]

## 2018-10-29 NOTE — H&P (Addendum)
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 Cancer [facility])    Prostate Cancer- low grade."watchful waiting being observed" -[facility]  . Diabetes mellitus without complication ([place])    J5-0 yrs on oral med  . Hypertension     Past Surgical History:  Procedure Laterality Date  . COLONOSCOPY W/ POLYPECTOMY    . COLONOSCOPY WITH PROPOFOL N/A 12/21/2015   Procedure: COLONOSCOPY WITH PROPOFOL;  Surgeon: Garlan Fair, MD;  Location: [facility];  Service: Endoscopy;  Laterality: N/A;  . ORIF FINGER / THUMB FRACTURE Right    no retained hardware  . 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 Over the last 2 weeks he has noticed increasing chest pressure and pain with activity. He is building a two story garage and doing a good bit of the manual labor himself. He has noticed when carrying heavy things or walking up incline some chest pressure that is relieved with rest. However he has noticed his chest pressure increasing in frequency. One episode of nausea/vomiting on Sunday.   Yesterday, he was carrying a box of clothes up stairs at home and had chest pressure. His wife encouraged him to go to Urgent Care and he then was sent to the ED. The chest pressure lasted 5 minutes and had been self relieved with rest prior to arrival.   EKG NSR, troponin 0.08. Heparin gtt was started.   Home Medications  Prior to Admission medications   Medication Sig Start Date End Date Taking? Authorizing Provider  Ascorbic Acid (VITAMIN C) 1000 MG tablet Take 1,000 mg by mouth daily.   Yes [provider]  atorvastatin (LIPITOR) 10 MG tablet Take 10 mg by mouth daily.   Yes [provider]  cholecalciferol (VITAMIN D) 1000 units tablet Take 1,000 Units by mouth daily.   Yes [provider]  glipiZIDE (GLUCOTROL XL) 2.5 MG 24 hr tablet Take 2.5 mg by mouth daily with breakfast.   Yes [provider]  losartan (COZAAR) 100 MG tablet Take 100 mg by mouth daily.   Yes [provider]    Family History  No family history on file.  Social History  Social History   Socioeconomic History  . Marital status: Married    Spouse name: Not on file  . Number of children: Not on file  . Years of education: Not on file  . Highest education level: Not on file  Occupational History  . Not on file  Social Needs  . Financial resource strain: Not on file  . Food insecurity:    Worry: Not on file    Inability: Not on file  . Transportation needs:    Medical: Not on file    Non-medical: Not on file  Tobacco Use  . Smoking status: Former Smoker    Years: 20.00    Types: Cigarettes    Last attempt to quit: 12/09/1978    Years since quitting: 39.9  Substance and Sexual Activity  . Alcohol use: Yes    Comment: 1-2 wine  . Drug use: No  . Sexual activity: Not  on file  Lifestyle  . Physical activity:    Days per week: Not on file    Minutes per session: Not on file  . Stress: Not on file  Relationships  . Social connections:    Talks on phone: Not on file    Gets together: Not on file    Attends religious service: Not on file    Active member of club or organization: Not on file    Attends meetings of clubs or organizations: Not on file    Relationship status: Not on file  . Intimate partner violence:    Fear of current or ex partner: Not on file    Emotionally abused: Not on file    Physically abused: Not on file    Forced sexual activity: Not on file  Other Topics Concern  . Not on file  Social History Narrative  . Not on file      Review of Systems General:  No chills, fever, night sweats or weight changes.  Cardiovascular:  +chest pain, +dyspnea on exertion, no edema, orthopnea, palpitations, paroxysmal nocturnal dyspnea. Dermatological: No rash, lesions/masses Respiratory: No cough, dyspnea Urologic: No hematuria, dysuria Abdominal:   No nausea, vomiting, diarrhea, bright red blood per rectum, melena, or hematemesis Neurologic:  No visual changes, wkns, changes in mental status. All other systems reviewed and are otherwise negative except as noted above.  Physical Exam  Blood pressure 128/60, pulse 66, temperature 98.2 F (36.8 C), temperature source Oral, resp. rate 18, height 5\' 6"  (1.676 m), weight 84.4 kg, SpO2 98 %.  General: Pleasant, NAD Psych: Normal affect. Neuro: Alert and oriented X 3. Moves all extremities spontaneously. HEENT: Normal  Neck: Supple without bruits or JVD. Lungs:  Resp regular and unlabored, CTA. Heart: RRR no s3, s4, or murmurs. Abdomen: Soft, non-tender, non-distended, BS + x 4.  Extremities: No clubbing, cyanosis or edema. DP/PT/Radials 2+ and equal bilaterally.  Labs  Troponin [facility] of Care Test) Recent Labs    10/28/18 2015  TROPIPOC 0.06   Recent Labs    10/29/18 0232  TROPONINI 0.08*   Lab Results  Component Value Date   WBC 7.7 10/28/2018   HGB 13.5 10/28/2018   HCT 43.9 10/28/2018   MCV 97.6 10/28/2018   PLT 283 10/28/2018    Recent Labs  Lab 10/28/18 2005  NA 144  K 4.6  CL 107  CO2 26  BUN 17  CREATININE 1.28*  CALCIUM 9.7  GLUCOSE 89   No results found for: CHOL, HDL, LDLCALC, TRIG No results found for: DDIMER   Radiology/Studies  Dg Chest 2 View  Result Date: 10/28/2018 CLINICAL DATA:  77 y/o  M; chest pain, nausea, vomiting. EXAM: CHEST - 2 VIEW COMPARISON:  None. FINDINGS: The heart size and mediastinal contours are within normal limits. No consolidation, effusion, or pneumothorax. No acute osseous abnormality is evident.  Mild-to-moderate spondylosis of the thoracic spine. IMPRESSION: No acute pulmonary process identified. Electronically Signed   By: Kristine Garbe M.D.   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 1.28 today - euvolemic on exam  # HTN - hold arb  # HLD - statin  FULL CODE  Signed, Charlies Silvers, MD Overnight Fellow   Attending addendum  History and all data above reviewed.  Patient examined.  I agree with the findings as above.  All available labs, radiology testing, previous records reviewed. Agree with documented assessment and plan. David Drake is a 31M with hypertension, hyperlipidemia, and diabetes here with NSTEMI.  He has increasing episodes of exertional angina over the last couple weeks.  He is currently chest pain-free.  Troponin was mildly elevated to 0.08.  EKG is without ischemic changes.  Given his classic angina and elevated troponin we will plan for left heart catheterization today.  Agree with the history and plan as outlined by Dr. Elson Areas above.  Risks and benefits of cardiac catheterization have been discussed with the patient.  The patient understands that risks included but are not limited to stroke (1 in 1000), death (1 in 10), kidney failure [usually temporary] (1 in 500), bleeding (1 in 200), allergic reaction [possibly serious] (1 in 200). The patient understands and agrees to proceed.    Earley Grobe C. Oval Linsey, MD, [facility]  10/29/2018 8:32 AM

## 2018-10-29 NOTE — ED Notes (Signed)
XR at bedside

## 2018-10-29 NOTE — Progress Notes (Signed)
ANTICOAGULATION CONSULT NOTE - Initial Consult  Pharmacy Consult for heparin Indication: chest pain/ACS  No Known Allergies  Patient Measurements: Height: 5\' 6"  (167.6 cm) Weight: 186 lb (84.4 kg) IBW/kg (Calculated) : 63.8 Heparin Dosing Weight: 80kg  Vital Signs: Temp: 98.2 F (36.8 C) (12/17 0128) Temp Source: Oral (12/17 0128) BP: 128/60 (12/17 0128) Pulse Rate: 66 (12/17 0128)  Labs: Recent Labs    10/28/18 2005 10/29/18 0232  HGB 13.5  --   HCT 43.9  --   PLT 283  --   CREATININE 1.28*  --   TROPONINI  --  0.08*    Estimated Creatinine Clearance: 49.2 mL/min (A) (by C-G formula based on SCr of 1.28 mg/dL (H)).   Medical History: Past Medical History:  Diagnosis Date  . Cancer [facility])    Prostate Cancer- low grade."watchful waiting being observed" -[facility]  . Diabetes mellitus without complication ([place])    V9-1 yrs on oral med  . Hypertension     Assessment: 77yo male c/o intermittent CP w/ exertion, initial troponin negative, now increasing, to begin heparin.  Goal of Therapy:  Heparin level 0.3-0.7 units/ml Monitor platelets by anticoagulation protocol: Yes   Plan:  Will give heparin 4000 units x1 followed by gtt at 1100 units/hr and monitor heparin levels and CBC.  Wynona Neat, PharmD, BCPS  10/29/2018,3:40 AM

## 2018-10-29 NOTE — Interval H&P Note (Signed)
History and Physical Interval Note:  10/29/2018 1:21 PM  David Drake  has presented today for cardiac cath with the diagnosis of NSTEMI.  The various methods of treatment have been discussed with the patient and family. After consideration of risks, benefits and other options for treatment, the patient has consented to  Procedure(s): LEFT HEART CATH AND CORONARY ANGIOGRAPHY (N/A) as a surgical intervention .  The patient's history has been reviewed, patient examined, no change in status, stable for surgery.  I have reviewed the patient's chart and labs.  Questions were answered to the patient's satisfaction.    Cath Lab Visit (complete for each Cath Lab visit)  Clinical Evaluation Leading to the Procedure:   ACS: Yes.    Non-ACS:    Anginal Classification: CCS III  Anti-ischemic medical therapy: No Therapy  Non-Invasive Test Results: No non-invasive testing performed  Prior CABG: No previous CABG        Lauree Chandler

## 2018-10-29 NOTE — ED Notes (Signed)
Cards at bedside

## 2018-10-30 ENCOUNTER — Inpatient Hospital Stay (HOSPITAL_COMMUNITY): Payer: Medicare Other

## 2018-10-30 ENCOUNTER — Encounter (HOSPITAL_COMMUNITY): Payer: Self-pay | Admitting: Cardiovascular Disease

## 2018-10-30 DIAGNOSIS — I10 Essential (primary) hypertension: Secondary | ICD-10-CM

## 2018-10-30 DIAGNOSIS — I361 Nonrheumatic tricuspid (valve) insufficiency: Secondary | ICD-10-CM

## 2018-10-30 DIAGNOSIS — E7800 Pure hypercholesterolemia, unspecified: Secondary | ICD-10-CM

## 2018-10-30 DIAGNOSIS — Z9861 Coronary angioplasty status: Secondary | ICD-10-CM

## 2018-10-30 DIAGNOSIS — I2510 Atherosclerotic heart disease of native coronary artery without angina pectoris: Secondary | ICD-10-CM

## 2018-10-30 DIAGNOSIS — E119 Type 2 diabetes mellitus without complications: Secondary | ICD-10-CM

## 2018-10-30 DIAGNOSIS — I370 Nonrheumatic pulmonary valve stenosis: Secondary | ICD-10-CM

## 2018-10-30 LAB — GLUCOSE, CAPILLARY
Glucose-Capillary: 88 mg/dL (ref 70–99)
Glucose-Capillary: 90 mg/dL (ref 70–99)

## 2018-10-30 LAB — BASIC METABOLIC PANEL
ANION GAP: 11 (ref 5–15)
BUN: 15 mg/dL (ref 8–23)
CO2: 23 mmol/L (ref 22–32)
Calcium: 8.4 mg/dL — ABNORMAL LOW (ref 8.9–10.3)
Chloride: 106 mmol/L (ref 98–111)
Creatinine, Ser: 1.33 mg/dL — ABNORMAL HIGH (ref 0.61–1.24)
GFR calc Af Amer: 59 mL/min — ABNORMAL LOW (ref >60)
GFR calc non Af Amer: 51 mL/min — ABNORMAL LOW (ref >60)
GLUCOSE: 97 mg/dL (ref 70–99)
Potassium: 4.5 mmol/L (ref 3.5–5.1)
Sodium: 140 mmol/L (ref 135–145)

## 2018-10-30 LAB — CBC
HCT: 37.8 % — ABNORMAL LOW (ref 39.0–52.0)
Hemoglobin: 12 g/dL — ABNORMAL LOW (ref 13.0–17.0)
MCH: 30.2 pg (ref 26.0–34.0)
MCHC: 31.7 g/dL (ref 30.0–36.0)
MCV: 95 fL (ref 80.0–100.0)
Platelets: 223 10*3/uL (ref 150–400)
RBC: 3.98 MIL/uL — ABNORMAL LOW (ref 4.22–5.81)
RDW: 11.5 % (ref 11.5–15.5)
WBC: 9 10*3/uL (ref 4.0–10.5)
nRBC: 0 % (ref 0.0–0.2)

## 2018-10-30 LAB — ECHOCARDIOGRAM COMPLETE
Height: 66 in
Weight: 2998.26 oz

## 2018-10-30 MED ORDER — METOPROLOL TARTRATE 25 MG PO TABS: 25.0000 mg | ORAL_TABLET | Freq: Two times a day (BID) | ORAL | 4 refills | Status: DC

## 2018-10-30 MED ORDER — ASPIRIN 81 MG PO TBEC: 81.0000 mg | DELAYED_RELEASE_TABLET | Freq: Every day | ORAL | Status: AC

## 2018-10-30 MED ORDER — TICAGRELOR 90 MG PO TABS: 90.0000 mg | ORAL_TABLET | Freq: Two times a day (BID) | ORAL | 9 refills | Status: DC

## 2018-10-30 MED ORDER — ATORVASTATIN CALCIUM 80 MG PO TABS: 80.0000 mg | ORAL_TABLET | Freq: Every day | ORAL | 4 refills | Status: DC

## 2018-10-30 MED FILL — Lidocaine HCl Local Preservative Free (PF) Inj 1%: INTRAMUSCULAR | Qty: 30 | Status: AC

## 2018-10-30 MED FILL — ATORVASTATIN CALCIUM 80 MG: 80 | 90 days supply | Qty: 90 | Fill #0

## 2018-10-30 MED FILL — METOPROLOL TARTRATE 25 MG T: 25 | 90 days supply | Qty: 180 | Fill #0

## 2018-10-30 MED FILL — BRILINTA 90 MG TABLET: 90 | 30 days supply | Qty: 60 | Fill #0

## 2018-10-30 NOTE — Discharge Instructions (Addendum)
Information about your medication: Brilinta (anti-platelet agent)  Generic Name (Brand): ticagrelor (Brilinta), twice daily medication  PURPOSE: You are taking this medication along with aspirin to lower your chance of having a heart attack, stroke, or blood clots in your heart stent. These can be fatal. Brilinta and aspirin help prevent platelets from sticking together and forming a clot that can block an artery or your stent.   Common SIDE EFFECTS you may experience include: bruising or bleeding more easily, shortness of breath (if notice, drink caffeinated beverage with dose; effect should disappear as continue therapy)  Do not stop taking BRILINTA without talking to the doctor who prescribes it for you. People who are treated with a stent and stop taking Brilinta too soon, have a higher risk of getting a blood clot in the stent, having a heart attack, or dying. If you stop Brilinta because of bleeding, or for other reasons, your risk of a heart attack or stroke may increase.   Tell all of your doctors and dentists that you are taking Brilinta. They should talk to the doctor who prescribed Brilinta for you before you have any surgery or invasive procedure.   Contact your health care provider if you experience: severe or uncontrollable bleeding, pink/red/brown urine, vomiting blood or vomit that looks like "coffee grounds", red or black stools (looks like tar), coughing up blood or blood clots ----------------------------------------------------------------------------------------------------------------------

## 2018-10-30 NOTE — Progress Notes (Signed)
Progress Note  Patient Name: David Drake Date of Encounter: 10/30/2018  Primary Cardiologist: Skeet Latch, MD   Subjective   Feeling well.  No chest pain or shortness of breath.   Inpatient Medications    Scheduled Meds: . aspirin EC  81 mg Oral Daily  . atorvastatin  80 mg Oral q1800  . cholecalciferol  1,000 Units Oral Daily  . glipiZIDE  2.5 mg Oral Q breakfast  . metoprolol tartrate  25 mg Oral BID  . sodium chloride flush  3 mL Intravenous Q12H  . ticagrelor  90 mg Oral BID  . vitamin C  1,000 mg Oral Daily   Continuous Infusions: . sodium chloride     PRN Meds: sodium chloride, sodium chloride flush   Vital Signs    Vitals:   10/29/18 1900 10/29/18 2117 10/30/18 0703 10/30/18 0909  BP: (!) 117/57 (!) 104/57 111/67 (!) 122/59  Pulse: 82 78 61 71  Resp: 14 15 13    Temp:  98.5 F (36.9 C) 98.5 F (36.9 C)   TempSrc:  Oral Oral   SpO2: 98% 98% 98%   Weight:   85 kg   Height:        Intake/Output Summary (Last 24 hours) at 10/30/2018 1013 Last data filed at 10/30/2018 0826 Gross per 24 hour  Intake 1143.75 ml  Output 950 ml  Net 193.75 ml   Filed Weights   10/28/18 1957 10/30/18 0703  Weight: 84.4 kg 85 kg    Telemetry    SR.  PVCs.  - Personally Reviewed  ECG    Sinus rhythm.  Rate 64 bpm.   - Personally Reviewed  Physical Exam   VS:  BP (!) 122/59   Pulse 71   Temp 98.5 F (36.9 C) (Oral)   Resp 13   Ht 5\' 6"  (1.676 m)   Wt 85 kg   SpO2 98%   BMI 30.25 kg/m  , BMI Body mass index is 30.25 kg/m. GENERAL:  Well appearing HEENT: Pupils equal round and reactive, fundi not visualized, oral mucosa unremarkable NECK:  No jugular venous distention, waveform within normal limits, carotid upstroke brisk and symmetric, no bruits, no thyromegaly LYMPHATICS:  No cervical adenopathy LUNGS:  Clear to auscultation bilaterally HEART:  RRR.  PMI not displaced or sustained,S1 and S2 within normal limits, no S3, no S4, no clicks, no rubs, no  murmurs ABD:  Flat, positive bowel sounds normal in frequency in pitch, no bruits, no rebound, no guarding, no midline pulsatile mass, no hepatomegaly, no splenomegaly EXT:  2 plus pulses throughout, no edema, no cyanosis no clubbing.  R radial cath site without hematoma but does have soft edema.  No ecchymosis. SKIN:  No rashes no nodules NEURO:  Cranial nerves II through XII grossly intact, motor grossly intact throughout [facility]:  Cognitively intact, oriented to person place and time   Labs    Chemistry Recent Labs  Lab 10/28/18 2005 10/30/18 0122  NA 144 140  K 4.6 4.5  CL 107 106  CO2 26 23  GLUCOSE 89 97  BUN 17 15  CREATININE 1.28* 1.33*  CALCIUM 9.7 8.4*  GFRNONAA 54* 51*  GFRAA >60 59*  ANIONGAP 11 11     Hematology Recent Labs  Lab 10/28/18 2005 10/30/18 0122  WBC 7.7 9.0  RBC 4.50 3.98*  HGB 13.5 12.0*  HCT 43.9 37.8*  MCV 97.6 95.0  MCH 30.0 30.2  MCHC 30.8 31.7  RDW 11.4* 11.5  PLT 283  223    Cardiac Enzymes Recent Labs  Lab 10/29/18 0232  TROPONINI 0.08*    Recent Labs  Lab 10/28/18 2015  TROPIPOC 0.06     BNPNo results for input(s): BNP, PROBNP in the last 168 hours.   DDimer No results for input(s): DDIMER in the last 168 hours.   Radiology    Dg Chest 2 View  Result Date: 10/28/2018 CLINICAL DATA:  77 y/o  M; chest pain, nausea, vomiting. EXAM: CHEST - 2 VIEW COMPARISON:  None. FINDINGS: The heart size and mediastinal contours are within normal limits. No consolidation, effusion, or pneumothorax. No acute osseous abnormality is evident. Mild-to-moderate spondylosis of the thoracic spine. IMPRESSION: No acute pulmonary process identified. Electronically Signed   By: Kristine Garbe M.D.   On: 10/28/2018 20:58   Dg Chest Portable 1 View  Result Date: 10/29/2018 CLINICAL DATA:  Worsening chest pain for a few weeks. EXAM: PORTABLE CHEST 1 VIEW COMPARISON:  None. FINDINGS: Cardiomediastinal silhouette is normal. No pleural  effusions or focal consolidations. Trachea projects midline and there is no pneumothorax. Soft tissue planes and included osseous structures are non-suspicious. Foreshortened distal LEFT clavicle may be postoperative. Thoracic spondylosis. IMPRESSION: No acute cardiopulmonary process. Electronically Signed   By: Elon Alas M.D.   On: 10/29/2018 05:33    Cardiac Studies   LHC 10/29/18:  Prox RCA lesion is 30% stenosed.  Prox RCA to Mid RCA lesion is 30% stenosed.  Mid RCA to Dist RCA lesion is 95% stenosed.  A drug-eluting stent was successfully placed using a STENT SYNERGY DES 3X32.  Post intervention, there is a 0% residual stenosis.  2nd Mrg lesion is 50% stenosed.  Ost 1st Diag lesion is 99% stenosed.  A drug-eluting stent was successfully placed using a STENT SYNERGY DES 2.25X12.  Post intervention, there is a 0% residual stenosis.  Prox LAD to Mid LAD lesion is 80% stenosed.  A drug-eluting stent was successfully placed using a STENT SYNERGY DES 2.5X16.  Post intervention, there is a 0% residual stenosis.  The left ventricular systolic function is normal.  LV end diastolic pressure is normal.  The left ventricular ejection fraction is 55-65% by visual estimate.  There is no mitral valve regurgitation.   1. Severe stenosis mid RCA. Successful PTCA/DES x 1 mid RCA 2. Severe stenosis proximal segment of the diagonal branch. Successful PTCA/DES x 1 Diagonal branch 3. Severe stenosis mid LAD. Successful PTCA/DES x 1 mid LAD 4. Moderate stenosis mid segment of the obtuse marginal branch 5. Normal LV systolic function  Recommendations: DAPT with ASA and Brilinta for one year. Continue statin and beta blocker. Likely discharge home in the am.   Patient Profile     David Drake is a 58M with hypertension, hyperlipidemia, and diabetes here with NSTEMI.  Assessment & Plan    # NSTEMI: Troponin was mildly elevated to 0.08.  Mr. Cardarelli underwent multivessel PCI on 12/17.   He had a drug-eluting stent placed in the mid RCA, D1, and mid LAD.  He has residual moderate stenosis of the OM.  He is to remain on dual antiplatelet therapy with aspirin and ticagrelor for 1 year.  Echo is pending.  # Hypertension: Metoprolol was started this admission.  His home losartan has been on hold due to low blood pressure and mild AKI.  His creatinine is elevated to 1.3.  It was 1.13 years ago.  It is unclear what his more recent baseline is.  Would plan to restart an ARB  or Entresto as an outpatient based upon his blood pressure and systolic function on echo.  # Hyperlipidemia: Atorvastatin was increased to 80 mg this admission.  He will need repeat lipids and a CMP in 6 months.  # DM:  Would recommend switching glipizide to metformin and Jardiance as an outpatient.   For questions or updates, please contact [place] Please consult [email or web address] for contact info under        Signed, Skeet Latch, MD  10/30/2018, 10:13 AM

## 2018-10-30 NOTE — Progress Notes (Signed)
CARDIAC REHAB PHASE I   PRE:  Rate/Rhythm: 67 SR  BP:  Supine: 114/64  Sitting:   Standing:    SaO2: 99%RA  MODE:  Ambulation: 500 ft   POST:  Rate/Rhythm: 93 SR  BP:  Supine:   Sitting: 122/59  Standing:    SaO2: 100%RA 0810-0900 Pt walked 500 ft on RA with steady gait and no CP. Tolerated well. MI education completed with pt and wife who voiced understanding. Stressed importance of brilinta with stent. Reviewed NTG use, MI restrictions, ex ed, gave heart healthy diet and discussed carbs with A1C of 6.3.  Discussed CRP 2 and referred to [facility] CRP 2 in [place].   Graylon Good, RN BSN  10/30/2018 8:57 AM

## 2018-10-30 NOTE — Progress Notes (Signed)
  Echocardiogram 2D Echocardiogram has been performed.  David Drake G David Drake 10/30/2018, 11:57 AM

## 2018-10-30 NOTE — Care Management Note (Signed)
Case Management Note  Patient Details  Name: David Drake MRN: [id number] Date of Birth: Apr 10, 1941  Subjective/Objective:  From home with wife, s/p stent intervention, will be on brilinta, NCM gave patient information regarding co pay for refills and that cardiologist has samples in his office.  RN to give patient 30 day coupon, or TOC pharmacy to fill scripts.                  Action/Plan: DC home when ready.  Expected Discharge Date:                  Expected Discharge Plan:  Home/Self Care  In-House Referral:     Discharge planning Services  CM Consult, Medication Assistance  Post Acute Care Choice:    Choice offered to:     DME Arranged:    DME Agency:     HH Arranged:    HH Agency:     Status of Service:  Completed, signed off  If discussed at [employer] of Stay Meetings, dates discussed:    Additional Comments:  Zenon Mayo, RN 10/30/2018, 12:37 PM

## 2018-10-30 NOTE — Care Management (Signed)
4 .   S/W   [place]  @  [facility] # [id number]   BRILINTA  90 MG BID COVER- YES CO-PAY- $ 183.79 TIER- 3 DRUG PRIOR APPROVAL- NO  DEDUCTIBLE : NOT MET  PREFERRED PHARMACY : YES [facility]   AND  [facility]

## 2018-10-30 NOTE — Discharge Summary (Signed)
Discharge Summary    Patient ID: David Drake MRN: [id number]; DOB: 12/03/1940  Admit date: 10/28/2018 Discharge date: 10/30/2018  Primary Care Provider: Wenda Low, MD  Primary Cardiologist: Dr. Oval Linsey, MD   Discharge Diagnoses    Principal Problem:   NSTEMI (non-ST elevated myocardial infarction) [facility]) Active Problems:   HTN (hypertension)   CAD S/P percutaneous coronary angioplasty; mid LAD, RCA, diag 10/29/18   DM (diabetes mellitus) ([place])  Allergies No Known Allergies  Diagnostic Studies/Procedures    Cath 10/29/18:   Prox RCA lesion is 30% stenosed.  Prox RCA to Mid RCA lesion is 30% stenosed.  Mid RCA to Dist RCA lesion is 95% stenosed.  A drug-eluting stent was successfully placed using a STENT SYNERGY DES 3X32.  Post intervention, there is a 0% residual stenosis.  2nd Mrg lesion is 50% stenosed.  Ost 1st Diag lesion is 99% stenosed.  A drug-eluting stent was successfully placed using a STENT SYNERGY DES 2.25X12.  Post intervention, there is a 0% residual stenosis.  Prox LAD to Mid LAD lesion is 80% stenosed.  A drug-eluting stent was successfully placed using a STENT SYNERGY DES 2.5X16.  Post intervention, there is a 0% residual stenosis.  The left ventricular systolic function is normal.  LV end diastolic pressure is normal.  The left ventricular ejection fraction is 55-65% by visual estimate.  There is no mitral valve regurgitation.   1. Severe stenosis mid RCA. Successful PTCA/DES x 1 mid RCA 2. Severe stenosis proximal segment of the diagonal branch. Successful PTCA/DES x 1 Diagonal branch 3. Severe stenosis mid LAD. Successful PTCA/DES x 1 mid LAD 4. Moderate stenosis mid segment of the obtuse marginal branch 5. Normal LV systolic function  Recommendations: DAPT with ASA and Brilinta for one year. Continue statin and beta blocker. Likely discharge home in the am.   Echocardiogram 10/30/18: Study Conclusions  - Left  ventricle: The cavity size was normal. Systolic function was   normal. The estimated ejection fraction was in the range of 60%   to 65%. Wall motion was normal; there were no regional wall   motion abnormalities. Left ventricular diastolic function   parameters were normal. - Left atrium: The atrium was mildly dilated. - Atrial septum: No defect or patent foramen ovale was identified.   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 He reported that he is in the process of building a two story garage and doing a good bit of the manual labor himself. He recently noticed that when carrying heavy things or walking up incline, he began having some chest pressure which was relieved with rest. He reported one episode of nausea/vomiting on Sunday, but otherwise no other associated symptoms of pain radiation. On day of presentation, he was carrying a box of clothes up stairs at home and had chest pressure. His wife encouraged him to go to Urgent Care and he was subsequently sent to the ED for further evaluation. He reported that the pressure lasted approximately 5 minutes and self relieved with rest prior to arrival.  He has no prior personal hx of CAD. Given the above, he was scheduled for a cardiac catheterization which was performed on 10/29/18.   Hospital Course  While in the hospital, his troponin levels were found to be elevated, up to 0.08. His EKG was without acute ischemic changes. He was started on a Hep gtt in anticipation of procedure. His cath revealed severe stenosis of the mid RCA, proximal segment of the diagonal branch and mid LAD in which he underwent three successful PTCA/DES stenting (mid RCA, diag branch, mid LAD).  Recommendations were for uninterrupted DAPT with ASA and Brilinta for one year. Continue statin and beta blocker. He has had no recurrent chest pain and has ambulated with cardiac rehabilitation without complication. His right radial site is swollen this morning but is soft without pain or ecchymosis.Pt reports that he resides outside of [facility] but will be staying with [facility] for the time being. He travels back and forth to [place] [facility] [place] and [place] for work often.  Will will obtain an echocardiogram prior to discharge>>>this was found to be normal with an LVEF of 60-65% with NWMA.   Other hospital problems include:  # Hypertension: Metoprolol was started this admission.  His home losartan has been on hold due to low blood pressure and mild AKI.  His creatinine is elevated to 1.3.  It was 77 years ago.  It is unclear what his more recent baseline is.  Would plan to restart an ARB or Entresto as an outpatient based upon his blood pressure and systolic function on echo.  # Hyperlipidemia: Atorvastatin was increased to 80 mg this admission.  He will need repeat lipids and a CMP in 6 months.  # DM:  Would recommend switching glipizide to metformin and Jardiance as an outpatient per PCP  -HbA1c, 6.3 on 10/29/18  The patient was seen and examined by Dr. Oval Linsey who feels that he is stable and ready for discharge today, 10/30/18 pending echocardiogram.   Consultants: None  _____________  Discharge Vitals Blood pressure (!) 106/58, pulse (!) 55, temperature 98.3 F (36.8 C), temperature source Oral, resp. rate 16, height 5\' 6"  (1.676 m), weight 85 kg, SpO2 97 %.  Filed Weights   10/28/18 1957 10/30/18 0703  Weight: 84.4 kg 85 kg   Labs & Radiologic Studies    CBC Recent Labs    10/28/18 2005 10/30/18 0122  WBC 7.7 9.0  HGB 13.5 12.0*  HCT 43.9 37.8*  MCV 97.6 95.0  PLT 283 621   Basic Metabolic Panel Recent Labs    10/28/18 2005 10/30/18 0122  NA 144 140  K 4.6 4.5  CL 107 106    CO2 26 23  GLUCOSE 89 97  BUN 17 15  CREATININE 1.28* 1.33*  CALCIUM 9.7 8.4*   Cardiac Enzymes Recent Labs    10/29/18 0232  TROPONINI 0.08*   Hemoglobin A1C Recent Labs    10/29/18 0527  HGBA1C 6.3*   Fasting Lipid Panel Recent Labs    10/29/18 0527  CHOL 135  HDL 46  LDLCALC 81  TRIG 40  CHOLHDL 2.9   ____________  Dg Chest 2 View  Result Date: 10/28/2018 CLINICAL DATA:  77 y/o  M; chest pain, nausea, vomiting. EXAM: CHEST - 2 VIEW COMPARISON:  None. FINDINGS: The heart size and mediastinal contours are within normal limits. No consolidation, effusion, or pneumothorax. No acute osseous abnormality is evident. Mild-to-moderate spondylosis of the thoracic spine. IMPRESSION: No acute pulmonary process identified. Electronically Signed   By: Kristine Garbe M.D.   On: 10/28/2018 20:58   Dg Chest Portable 1 View  Result Date: 10/29/2018 CLINICAL DATA:  Worsening chest pain  for a few weeks. EXAM: PORTABLE CHEST 1 VIEW COMPARISON:  None. FINDINGS: Cardiomediastinal silhouette is normal. No pleural effusions or focal consolidations. Trachea projects midline and there is no pneumothorax. Soft tissue planes and included osseous structures are non-suspicious. Foreshortened distal LEFT clavicle may be postoperative. Thoracic spondylosis. IMPRESSION: No acute cardiopulmonary process. Electronically Signed   By: Elon Alas M.D.   On: 10/29/2018 05:33   Disposition   Pt is being discharged home today in good condition.  Follow-up Plans & Appointments   Follow-up Information    Erlene Quan, PA-C Follow up on 11/14/2018.   Specialties:  Cardiology, Radiology Why:  Your follow up appointment will be on 11/14/2018 at 0830am. Please arrive by 0815am.  Contact information: [address] [place] [address] [phone]          Discharge Instructions    Amb Referral to Cardiac Rehabilitation   Complete by:  As directed    Referring to  [facility] CRP 2   Diagnosis:   NSTEMI Coronary Stents     Call MD for:  difficulty breathing, headache or visual disturbances   Complete by:  As directed    Call MD for:  extreme fatigue   Complete by:  As directed    Call MD for:  hives   Complete by:  As directed    Call MD for:  persistant dizziness or light-headedness   Complete by:  As directed    Call MD for:  persistant nausea and vomiting   Complete by:  As directed    Call MD for:  redness, tenderness, or signs of infection (pain, swelling, redness, odor or green/yellow discharge around incision site)   Complete by:  As directed    Call MD for:  severe uncontrolled pain   Complete by:  As directed    Call MD for:  temperature >100.4   Complete by:  As directed    Diet - low sodium heart healthy   Complete by:  As directed    Discharge instructions   Complete by:  As directed    No driving for 3 days. No lifting over 5 lbs for 1 week. No sexual activity for 1 week. Keep procedure site clean & dry. If you notice increased pain, swelling, bleeding or pus, call/return!  You may shower, but no soaking baths/hot tubs/pools for 1 week.   PLEASE DO NOT MISS ANY DOSES OF YOUR BRILINTA!!!!! Also keep a log of you blood pressures and bring back to your follow up appt. Please call the office with any questions.   Patients taking blood thinners should generally stay away from medicines like ibuprofen, Advil, Motrin, naproxen, and Aleve due to risk of stomach bleeding. You may take Tylenol as directed or talk to your primary doctor about alternatives.  Some studies suggest Prilosec/Omeprazole interacts with Plavix. If you have reflux symptoms please use Protonix for less chance of interaction.   If you notice any bleeding such as blood in stool, black tarry stools, blood in urine, nosebleeds or any other unusual bleeding, call your doctor immediately.  Follow soon with your PCP for additional changes if needed.   Increase activity slowly    Complete by:  As directed      Discharge Medications   Allergies as of 10/30/2018   No Known Allergies     Medication List    TAKE these medications   aspirin 81 MG EC tablet Take 1 tablet (81 mg total) by mouth daily.  Start taking on:  October 31, 2018   atorvastatin 80 MG tablet Commonly known as:  LIPITOR Take 1 tablet (80 mg total) by mouth daily. What changed:    medication strength  how much to take   cholecalciferol 1000 units tablet Commonly known as:  VITAMIN D Take 1,000 Units by mouth daily.   glipiZIDE 2.5 MG 24 hr tablet Commonly known as:  GLUCOTROL XL Take 2.5 mg by mouth daily with breakfast.   losartan 100 MG tablet Commonly known as:  COZAAR Take 100 mg by mouth daily.   metoprolol tartrate 25 MG tablet Commonly known as:  LOPRESSOR Take 1 tablet (25 mg total) by mouth 2 (two) times daily.   ticagrelor 90 MG Tabs tablet Commonly known as:  BRILINTA Take 1 tablet (90 mg total) by mouth 2 (two) times daily.   vitamin C 1000 MG tablet Take 1,000 mg by mouth daily.        Acute coronary syndrome (MI, NSTEMI, STEMI, etc) this admission?: Yes.     AHA/ACC Clinical Performance & Quality Measures: 1. Aspirin prescribed? - Yes 2. ADP Receptor Inhibitor (Plavix/Clopidogrel, Brilinta/Ticagrelor or Effient/Prasugrel) prescribed (includes medically managed patients)? - Yes 3. Beta Blocker prescribed? - Yes 4. High Intensity Statin (Lipitor 40-80mg  or Crestor 20-40mg ) prescribed? - Yes 5. EF assessed during THIS hospitalization? - Yes 6. For EF <40%, was ACEI/ARB prescribed? - Yes 7. For EF <40%, Aldosterone Antagonist (Spironolactone or Eplerenone) prescribed? - No, BP too low  8. Cardiac Rehab Phase II ordered (Included Medically managed Patients)? - Yes   Outstanding Labs/Studies   Will need BMET at follow up visit and LFT's and lipid panel in 6-8 weeks   Duration of Discharge Encounter   Greater than 30 minutes including physician  time.  Signed, Kathyrn Drown, NP 10/30/2018, 12:51 PM    \

## 2018-11-14 ENCOUNTER — Ambulatory Visit: Payer: Federal, State, Local not specified - PPO | Admitting: Cardiology

## 2018-11-18 ENCOUNTER — Telehealth: Payer: Self-pay | Admitting: Cardiology

## 2018-11-18 NOTE — Telephone Encounter (Signed)
New Message    *STAT* If patient is at the pharmacy, call can be transferred to refill team.   1. Which medications need to be refilled? (please list name of each medication and dose if known) ticagrelor (BRILINTA) 90 MG TABS tablet 30 day supply, atorvastatin (LIPITOR) 80 MG tablet 90 day supply, and metoprolol tartrate (LOPRESSOR) 25 MG tablet 90 day supply  2. Which pharmacy/location (including street and city if local pharmacy) is medication to be sent to? [facility]- [facility] [phone]  3. Do they need a 30 day or 90 day supply? 30 90 90

## 2018-11-20 ENCOUNTER — Telehealth: Payer: Self-pay | Admitting: Cardiology

## 2018-11-20 MED ORDER — TICAGRELOR 90 MG PO TABS: 90.0000 mg | ORAL_TABLET | Freq: Two times a day (BID) | ORAL | 11 refills | Status: DC

## 2018-11-20 MED ORDER — METOPROLOL TARTRATE 25 MG PO TABS: 25.0000 mg | ORAL_TABLET | Freq: Two times a day (BID) | ORAL | 4 refills | Status: DC

## 2018-11-20 MED ORDER — TICAGRELOR 90 MG PO TABS: 90.0000 mg | ORAL_TABLET | Freq: Two times a day (BID) | ORAL | 3 refills | Status: DC

## 2018-11-20 MED ORDER — ATORVASTATIN CALCIUM 80 MG PO TABS: 80.0000 mg | ORAL_TABLET | Freq: Every day | ORAL | 4 refills | Status: DC

## 2018-11-20 NOTE — Telephone Encounter (Signed)
Refill sent to the pharmacy electronically.  

## 2018-11-20 NOTE — Telephone Encounter (Signed)
Pt's wife calling about pt's medication Brilinta 90 mg. Wife stated that this medication was sent in to [facility] for a 30 day supply and it needed to be sent in for a 90 day supply because it is a mail order pharmacy and it cost for a 30 day supply is the same for a 90 day supply. Please address

## 2018-11-20 NOTE — Telephone Encounter (Signed)
New Message    *STAT* If patient is at the pharmacy, call can be transferred to refill team.   1. Which medications need to be refilled? (please list name of each medication and dose if known) ticagrelor (BRILINTA) 90 MG TABS tablet  2. Which pharmacy/location (including street and city if local pharmacy) is medication to be sent to?  [facility]- [facility] [phone]  3. Do they need a 30 day or 90 day supply? 90 day   Patient wife is calling on behalf. They are needing the rx for the Brilinta to be sent for a 90 day supply because it cost the same as a 30 day supply. Therefore its more cost effective for a 90 day.

## 2018-11-22 NOTE — Progress Notes (Signed)
Transitions of Care Follow Up Call Note  David Drake is an 78 y.o. male who presented to [facility] on 10/28/2018.  The patient had the following prescriptions filled at [facility]: Brilinta, Metoprolol tartrate, Atorvastatin.   Patient was called by pharmacist and HIPAA identifiers were verified. The following questions were asked about the prescriptions filled at [place]:  Has the patient been experiencing any side effects to the medications prescribed? no Understanding of regimen: excellent Understanding of indications: excellent Potential of compliance: excellent  Pharmacist comments: Spoke with patient who confirmed that he has had no side effects to the medications. Patient stated that he gets his prescriptions through mail order and his primary care physician had sent those electronically already to the mail order pharmacy. Patient was extremely pleased about the follow-up call and had no questions or complaints.   [x]  Patient's prescriptions already renewed by physician and delivered to patient's home by mail-order pharmacy. []  Patient's prescriptions filled at the [facility] Transitions of Care Pharmacy were transferred to the following pharmacy: N/A  []  Patient unable to be reached after calling three times and prescriptions filled at the [facility] Transitions of Care Pharmacy were transferred to preferred pharmacy found within their chart.    David Drake [name] 11/22/2018, 5:16 PM Transitions of Care Pharmacy Hours: Monday - Friday 8:30am to 5:00 PM  Phone - [phone]

## 2018-11-25 ENCOUNTER — Ambulatory Visit: Payer: Federal, State, Local not specified - PPO | Admitting: Cardiology

## 2018-11-25 ENCOUNTER — Encounter: Payer: Self-pay | Admitting: Cardiology

## 2018-11-25 VITALS — BP 106/64 | HR 61 | Ht 66.0 in | Wt 191.0 lb

## 2018-11-25 DIAGNOSIS — I214 Non-ST elevation (NSTEMI) myocardial infarction: Secondary | ICD-10-CM | POA: Diagnosis not present

## 2018-11-25 DIAGNOSIS — Z79899 Other long term (current) drug therapy: Secondary | ICD-10-CM

## 2018-11-25 DIAGNOSIS — N2889 Other specified disorders of kidney and ureter: Secondary | ICD-10-CM | POA: Insufficient documentation

## 2018-11-25 DIAGNOSIS — E785 Hyperlipidemia, unspecified: Secondary | ICD-10-CM

## 2018-11-25 DIAGNOSIS — N183 Chronic kidney disease, stage 3 unspecified: Secondary | ICD-10-CM

## 2018-11-25 DIAGNOSIS — I2510 Atherosclerotic heart disease of native coronary artery without angina pectoris: Secondary | ICD-10-CM

## 2018-11-25 DIAGNOSIS — E119 Type 2 diabetes mellitus without complications: Secondary | ICD-10-CM

## 2018-11-25 DIAGNOSIS — Z9861 Coronary angioplasty status: Secondary | ICD-10-CM

## 2018-11-25 DIAGNOSIS — I10 Essential (primary) hypertension: Secondary | ICD-10-CM

## 2018-11-25 NOTE — Assessment & Plan Note (Signed)
PCI with DES- mid LAD, RCA, and Dx1 10/29/18 

## 2018-11-25 NOTE — Assessment & Plan Note (Signed)
CRI-3. GFR 51

## 2018-11-25 NOTE — Assessment & Plan Note (Signed)
LDL 80 on admission in Dec -(on Lipitor 10 mg)- dose increased to 80 mg after his NSTEMI/ PCI

## 2018-11-25 NOTE — Progress Notes (Signed)
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  He has a history of hypertension, dyslipidemia, non-insulin-dependent diabetes, and chronic renal insufficiency stage III.  He is also had remote prostate cancer and cervical spine DJD. He presented December 2019 with a non-ST elevation MI.  Cath revealed three-vessel disease and he underwent three-vessel stenting with a PCI and DES to his RCA, diagonal, and LAD.  His LV function was normal with an EF of 55 to 60%.  He seen in the office today for follow-up.  He done well since discharge.  He has been limiting his activity.  He had noticed some vague shortness of breath early on but this seems to have resolved, it was most likely from the [place].  He has not had chest pain.   Current Outpatient Medications  Medication Sig Dispense Refill  . Ascorbic Acid (VITAMIN C) 1000 MG tablet Take 1,000 mg by mouth daily.    Marland Kitchen aspirin EC 81 MG EC tablet Take 1 tablet (81 mg total) by mouth daily.    Marland Kitchen atorvastatin (LIPITOR) 80 MG tablet Take 1 tablet (80 mg total) by mouth daily. 90 tablet 4  . cholecalciferol (VITAMIN D) 1000 units tablet Take 1,000 Units by mouth daily.    Marland Kitchen glipiZIDE (GLUCOTROL) 5 MG tablet Take 5 mg by mouth daily before breakfast.    . losartan (COZAAR) 100 MG tablet Take 100 mg by mouth daily.    . metoprolol tartrate (LOPRESSOR) 25 MG tablet Take 1 tablet (25 mg total) by mouth 2 (two) times daily. 180 tablet 4  . ticagrelor (BRILINTA) 90 MG TABS tablet Take 1 tablet (90 mg total) by mouth 2 (two) times daily. 180 tablet 3   No current facility-administered medications for this visit.     No Known Allergies  Past Medical History:  Diagnosis Date  . 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 Basal cell carcinoma 1990s   "chin"  .  GERD (gastroesophageal reflux disease)   . High cholesterol   . Hypertension   . NSTEMI (non-ST elevated myocardial infarction) ([place]) 10/29/2018  . Pneumonia    "walking pneumonia a number of times; always in the fall; probably related to allergies; nothing in 4-5 years" (10/29/2018)  . Prostate cancer [facility])    Prostate Cancer- Drake grade."watchful waiting being observed" -[facility]  . Type II diabetes mellitus ([place])     Social History   Socioeconomic History  . Marital status: Married    Spouse name: Not on file  . Number of children: Not on file  . Years of education: Not on file  . Highest education level: Not on file  Occupational History  . Not on file  Social Needs  . Financial resource strain: Not on file  . Food insecurity:    Worry: Not on file    Inability: Not on file  . Transportation needs:    Medical: Not on file    Non-medical: Not on file  Tobacco Use  . Smoking status: Former Smoker    Packs/day: 1.00    Years: 20.00    Pack years: 20.00    Types: Cigarettes    Last attempt to quit: 1982    Years since quitting: 38.0  . Smokeless tobacco: Never Used  Substance and Sexual Activity  .  Alcohol use: Yes    Alcohol/week: 18.0 standard drinks    Types: 18 Glasses of wine per week    Comment: 10/29/2018 "10oz wine/night"  . Drug use: Never  . Sexual activity: Not Currently  Lifestyle  . Physical activity:    Days per week: Not on file    Minutes per session: Not on file  . Stress: Not on file  Relationships  . Social connections:    Talks on phone: Not on file    Gets together: Not on file    Attends religious service: Not on file    Active member of club or organization: Not on file    Attends meetings of clubs or organizations: Not on file    Relationship status: Not on file  . Intimate partner violence:    Fear of current or ex partner: Not on file    Emotionally abused: Not on file    Physically abused: Not on file    Forced sexual  activity: Not on file  Other Topics Concern  . Not on file  Social History Narrative  . Not on file     Family History  Problem Relation Age of Onset  . CAD Father 9       CABG     Review of Systems: General: negative for chills, fever, night sweats or weight changes.  Cardiovascular: negative for chest pain, dyspnea on exertion, edema, orthopnea, palpitations, paroxysmal nocturnal dyspnea or shortness of breath Dermatological: negative for rash Respiratory: negative for cough or wheezing Urologic: negative for hematuria Abdominal: negative for nausea, vomiting, diarrhea, bright red blood per rectum, melena, or hematemesis Neurologic: negative for visual changes, syncope, or dizziness  Occasional numbness in his hands which he attributes to C-spine DJD All other systems reviewed and are otherwise negative except as noted above.    Blood pressure 106/64, pulse 61, height _0  (1.676 m), weight 191 lb (86.6 kg), SpO2 98 %.  General appearance: alert, cooperative and no distress Neck: no carotid bruit and no JVD Lungs: clear to auscultation bilaterally Heart: regular rate and rhythm Extremities: extremities normal, atraumatic, no cyanosis or edema Skin: Skin color, texture, turgor normal. No rashes or lesions Neurologic: Grossly normal  EKG NSR, incomplete RBBB  ASSESSMENT AND PLAN:   NSTEMI (non-ST elevated myocardial infarction) [facility]) NSTEMI Dec 17-2019. Troponin pk 0.08  CAD S/P percutaneous coronary angioplasty;  PCI with DES- mid LAD, RCA, and Dx1 10/29/18  Non-insulin dependent type 2 diabetes mellitus ([place]) Consider Jardiance- pt to discuss with his PCP  Essential hypertension Controlled- normal LVF   CRI (chronic renal insufficiency), stage 3 (moderate) (HCC) CRI-3. GFR 51   PLAN David Drake was a little confused about his statin dose.  He has both atorvastatin 10 mg which he was on prior to admission as well as atorvastatin 80 which he was prescribed at  discharge.  I instructed him to just take the 80 mg.  We will check a c-Met and fasting lipids in 6 weeks.  I told him he could gradually increase his activity, he wants to know if he could play golf.  We will have Dr. Oval Drake see him in 6 months.  Of also asked him to discuss Jardiance with his PCP when he sees him in a few weeks.  Geryl Councilman PA-C 11/25/2018 10:22 AM

## 2018-11-25 NOTE — Assessment & Plan Note (Signed)
Consider Jardiance- pt to discuss with his PCP

## 2018-11-25 NOTE — Assessment & Plan Note (Signed)
NSTEMI Dec 17-2019. Troponin pk 0.08

## 2018-11-25 NOTE — Assessment & Plan Note (Signed)
Controlled- normal LVF

## 2018-11-25 NOTE — Patient Instructions (Signed)
Medication Instructions:  Your Physician recommend you continue on your current medication as directed.    If you need a refill on your cardiac medications before your next appointment, please call your pharmacy.   Lab work: Your physician recommends that you return for lab work in 6 weeks (CMP, LIPID)  If you have labs (blood work) drawn today and your tests are completely normal, you will receive your results only by: Marland Kitchen MyChart Message (if you have MyChart) OR . A paper copy in the mail If you have any lab test that is abnormal or we need to change your treatment, we will call you to review the results.  Testing/Procedures: None  Follow-Up: At [facility], you and your health needs are our priority.  As part of our continuing mission to provide you with exceptional heart care, we have created designated Provider Care Teams.  These Care Teams include your primary Cardiologist (physician) and Advanced Practice Providers (APPs -  Physician Assistants and Nurse Practitioners) who all work together to provide you with the care you need, when you need it. You will need a follow up appointment in 6 months.  Please call our office 2 months in advance to schedule this appointment.  You may see Skeet Latch, MD or one of the following Advanced Practice Providers on your designated Care Team:   Kerin Ransom, PA-C Roby Lofts, [place] . Sande Rives, PA-C

## 2018-12-03 ENCOUNTER — Telehealth: Payer: Self-pay

## 2018-12-03 NOTE — Telephone Encounter (Signed)
   [facility] Pre-operative Risk Assessment    Request for surgical clearance:  1. What type of surgery is being performed? DENTAL FILLING  2. When is this surgery scheduled?  12/03/2018 (TODAY AT 4PM)  3. What type of clearance is required (medical clearance vs. Pharmacy clearance to hold med vs. Both)? MEDICAL  4. Are there any medications that need to be held prior to surgery and how long?NO  5. Practice name and name of physician performing surgery? [facility]   6. What is your office phone number [phone]   7.   What is your office fax number [phone]  8.   Anesthesia type (None, local, MAC, general) ? Ledon Snare, Ilena Dieckman T 12/03/2018, 1:56 PM  _________________________________________________________________   (provider comments below)

## 2018-12-03 NOTE — Telephone Encounter (Signed)
   Primary Cardiologist: Skeet Latch, MD  Chart reviewed as part of pre-operative protocol coverage. Given past medical history and time since last visit, based on ACC/AHA guidelines, David Drake would be at acceptable risk for the planned procedure without further cardiovascular testing.   I will route this recommendation to the requesting party via Epic fax function and remove from pre-op pool.  Please call with questions.  Kerin Ransom, PA-C 12/03/2018, 5:00 PM   Antibiotics not required for cardiac stent patients for dental procedures.

## 2018-12-03 NOTE — Telephone Encounter (Signed)
Received fax from Dr Benna Dunks' office stating patient has a history of stents and wanted to know if patient will need a antibiotic prior to any dental procedures. The letter stated that they do not think this is needed but wanted to be safe than sorry.

## 2018-12-03 NOTE — Telephone Encounter (Signed)
Received a call from patient stating he is having a fill in today at 4pm and wanted to know if he needed an antibiotic. I told we have a process and I would have to contact his dentist. I told him I would contact his dentist and go from there. I advised patient that I could not make any promises but we would do our best to get back with him today. He voiced understanding.   Contacted Dr Elon Spanner, DDS's office and requested they fax over a pre op request. The receptionist voiced understanding and said she would send it right over.

## 2019-01-07 LAB — COMPREHENSIVE METABOLIC PANEL
ALT: 27 IU/L (ref 0–44)
AST: 22 IU/L (ref 0–40)
Albumin/Globulin Ratio: 2.6 — ABNORMAL HIGH (ref 1.2–2.2)
Albumin: 4.6 g/dL (ref 3.7–4.7)
Alkaline Phosphatase: 71 IU/L (ref 39–117)
BUN/Creatinine Ratio: 16 (ref 10–24)
BUN: 21 mg/dL (ref 8–27)
Bilirubin Total: 1.1 mg/dL (ref 0.0–1.2)
CO2: 21 mmol/L (ref 20–29)
Calcium: 8.9 mg/dL (ref 8.6–10.2)
Chloride: 109 mmol/L — ABNORMAL HIGH (ref 96–106)
Creatinine, Ser: 1.31 mg/dL — ABNORMAL HIGH (ref 0.76–1.27)
GFR calc Af Amer: 60 mL/min/{1.73_m2} (ref >59)
GFR calc non Af Amer: 52 mL/min/{1.73_m2} — ABNORMAL LOW (ref >59)
Globulin, Total: 1.8 g/dL (ref 1.5–4.5)
Glucose: 145 mg/dL — ABNORMAL HIGH (ref 65–99)
Potassium: 5.5 mmol/L — ABNORMAL HIGH (ref 3.5–5.2)
Sodium: 145 mmol/L — ABNORMAL HIGH (ref 134–144)
Total Protein: 6.4 g/dL (ref 6.0–8.5)

## 2019-01-07 LAB — LIPID PANEL
Chol/HDL Ratio: 3 ratio (ref 0.0–5.0)
Cholesterol, Total: 121 mg/dL (ref 100–199)
HDL: 40 mg/dL (ref >39)
LDL Calculated: 57 mg/dL (ref 0–99)
Triglycerides: 120 mg/dL (ref 0–149)
VLDL Cholesterol Cal: 24 mg/dL (ref 5–40)

## 2019-01-10 ENCOUNTER — Other Ambulatory Visit: Payer: Self-pay

## 2019-01-10 ENCOUNTER — Telehealth: Payer: Self-pay | Admitting: Cardiology

## 2019-01-10 DIAGNOSIS — E875 Hyperkalemia: Secondary | ICD-10-CM

## 2019-01-10 NOTE — Telephone Encounter (Signed)
New Message   PT is returning call for lab results  Please call back

## 2019-01-10 NOTE — Telephone Encounter (Signed)
Patient called regarding lab results, patient would like to have [facility] draw his blood work in 2 weeks. BMET reordered, released.  No other questions at this time.

## 2019-01-28 ENCOUNTER — Telehealth: Payer: Self-pay | Admitting: Cardiology

## 2019-01-28 DIAGNOSIS — E875 Hyperkalemia: Secondary | ICD-10-CM

## 2019-01-28 LAB — BASIC METABOLIC PANEL
BUN/Creatinine Ratio: 12 (ref 10–24)
BUN: 18 mg/dL (ref 8–27)
CO2: 23 mmol/L (ref 20–29)
Calcium: 9.2 mg/dL (ref 8.6–10.2)
Chloride: 106 mmol/L (ref 96–106)
Creatinine, Ser: 1.47 mg/dL — ABNORMAL HIGH (ref 0.76–1.27)
GFR calc Af Amer: 52 mL/min/{1.73_m2} — ABNORMAL LOW (ref >59)
GFR calc non Af Amer: 45 mL/min/{1.73_m2} — ABNORMAL LOW (ref >59)
Glucose: 150 mg/dL — ABNORMAL HIGH (ref 65–99)
Potassium: 5.8 mmol/L (ref 3.5–5.2)
Sodium: 144 mmol/L (ref 134–144)

## 2019-01-28 NOTE — Telephone Encounter (Signed)
[facility] called stating that patient had a BMET drawn yesterday and K+ was 5.8 (5.5 several weeks ago).  He is not on any potassium suppl.  ? Hemolysis.  Please have patient come back in in the am for repeat BMET and have it run stat so it comes back quickly. May need to stop ARB.  If K+ still elevated with no evidence of hemolysis then recommend Kayexalate

## 2019-01-28 NOTE — Addendum Note (Signed)
Addended by: Caprice Beaver T on: 01/28/2019 08:16 AM   Modules accepted: Orders

## 2019-01-28 NOTE — Telephone Encounter (Signed)
Called patient, advised of lab work. Patient is coming in this morning to have repeat BMET completed.

## 2019-01-29 LAB — BASIC METABOLIC PANEL
BUN/Creatinine Ratio: 13 (ref 10–24)
BUN: 17 mg/dL (ref 8–27)
CO2: 22 mmol/L (ref 20–29)
Calcium: 9.3 mg/dL (ref 8.6–10.2)
Chloride: 106 mmol/L (ref 96–106)
Creatinine, Ser: 1.27 mg/dL (ref 0.76–1.27)
GFR calc Af Amer: 63 mL/min/{1.73_m2} (ref >59)
GFR, EST NON AFRICAN AMERICAN: 54 mL/min/{1.73_m2} — AB (ref >59)
Glucose: 195 mg/dL — ABNORMAL HIGH (ref 65–99)
Potassium: 5.6 mmol/L — ABNORMAL HIGH (ref 3.5–5.2)
Sodium: 144 mmol/L (ref 134–144)

## 2019-01-30 ENCOUNTER — Telehealth: Payer: Self-pay | Admitting: *Deleted

## 2019-01-30 DIAGNOSIS — E875 Hyperkalemia: Secondary | ICD-10-CM

## 2019-01-30 NOTE — Telephone Encounter (Signed)
Left message to call back  

## 2019-01-30 NOTE — Telephone Encounter (Signed)
-----   Message from Skeet Latch, MD sent at 01/29/2019 12:53 PM EDT ----- Kidney function is improving.  Potassium is still a little high.  Reduce losartan to 50mg .  Track BP and repeat BMP in 1 week.  PharmD appointment in 1 month for BP control.

## 2019-01-31 NOTE — Telephone Encounter (Signed)
He should have another BMP Monday  Bell Cai PA-C 01/31/2019 10:20 AM

## 2019-01-31 NOTE — Telephone Encounter (Signed)
Spoke with patient and he has been drinking almond milk and eating almonds daily.  Advised patient to decrease almond intake and recheck labs Monday, verbalized understanding.

## 2019-01-31 NOTE — Telephone Encounter (Signed)
Spoke with patient and Dr Lysle Rubens stopped his Losartan in January secondary to high potassium. Did confirm patient not taking any OTC potassium, eating high potassium foods, or using any type of salt substitute. Will forward to [place] for review

## 2019-01-31 NOTE — Telephone Encounter (Signed)
Follow Up:; ° ° °Returning your call. °

## 2019-02-04 LAB — BASIC METABOLIC PANEL
BUN/Creatinine Ratio: 13 (ref 10–24)
BUN: 17 mg/dL (ref 8–27)
CO2: 22 mmol/L (ref 20–29)
Calcium: 9.2 mg/dL (ref 8.6–10.2)
Chloride: 104 mmol/L (ref 96–106)
Creatinine, Ser: 1.29 mg/dL — ABNORMAL HIGH (ref 0.76–1.27)
GFR calc non Af Amer: 53 mL/min/{1.73_m2} — ABNORMAL LOW (ref >59)
GFR, EST AFRICAN AMERICAN: 61 mL/min/{1.73_m2} (ref >59)
Glucose: 103 mg/dL — ABNORMAL HIGH (ref 65–99)
Potassium: 5.4 mmol/L — ABNORMAL HIGH (ref 3.5–5.2)
Sodium: 142 mmol/L (ref 134–144)

## 2019-02-17 IMAGING — DX DG CHEST 1V PORT
1 series · 1 of 1 positions shown · non-contrast
Comparison: None.

CLINICAL DATA: Worsening chest pain for a few weeks.

EXAM:
PORTABLE CHEST 1 VIEW

[chest ap]
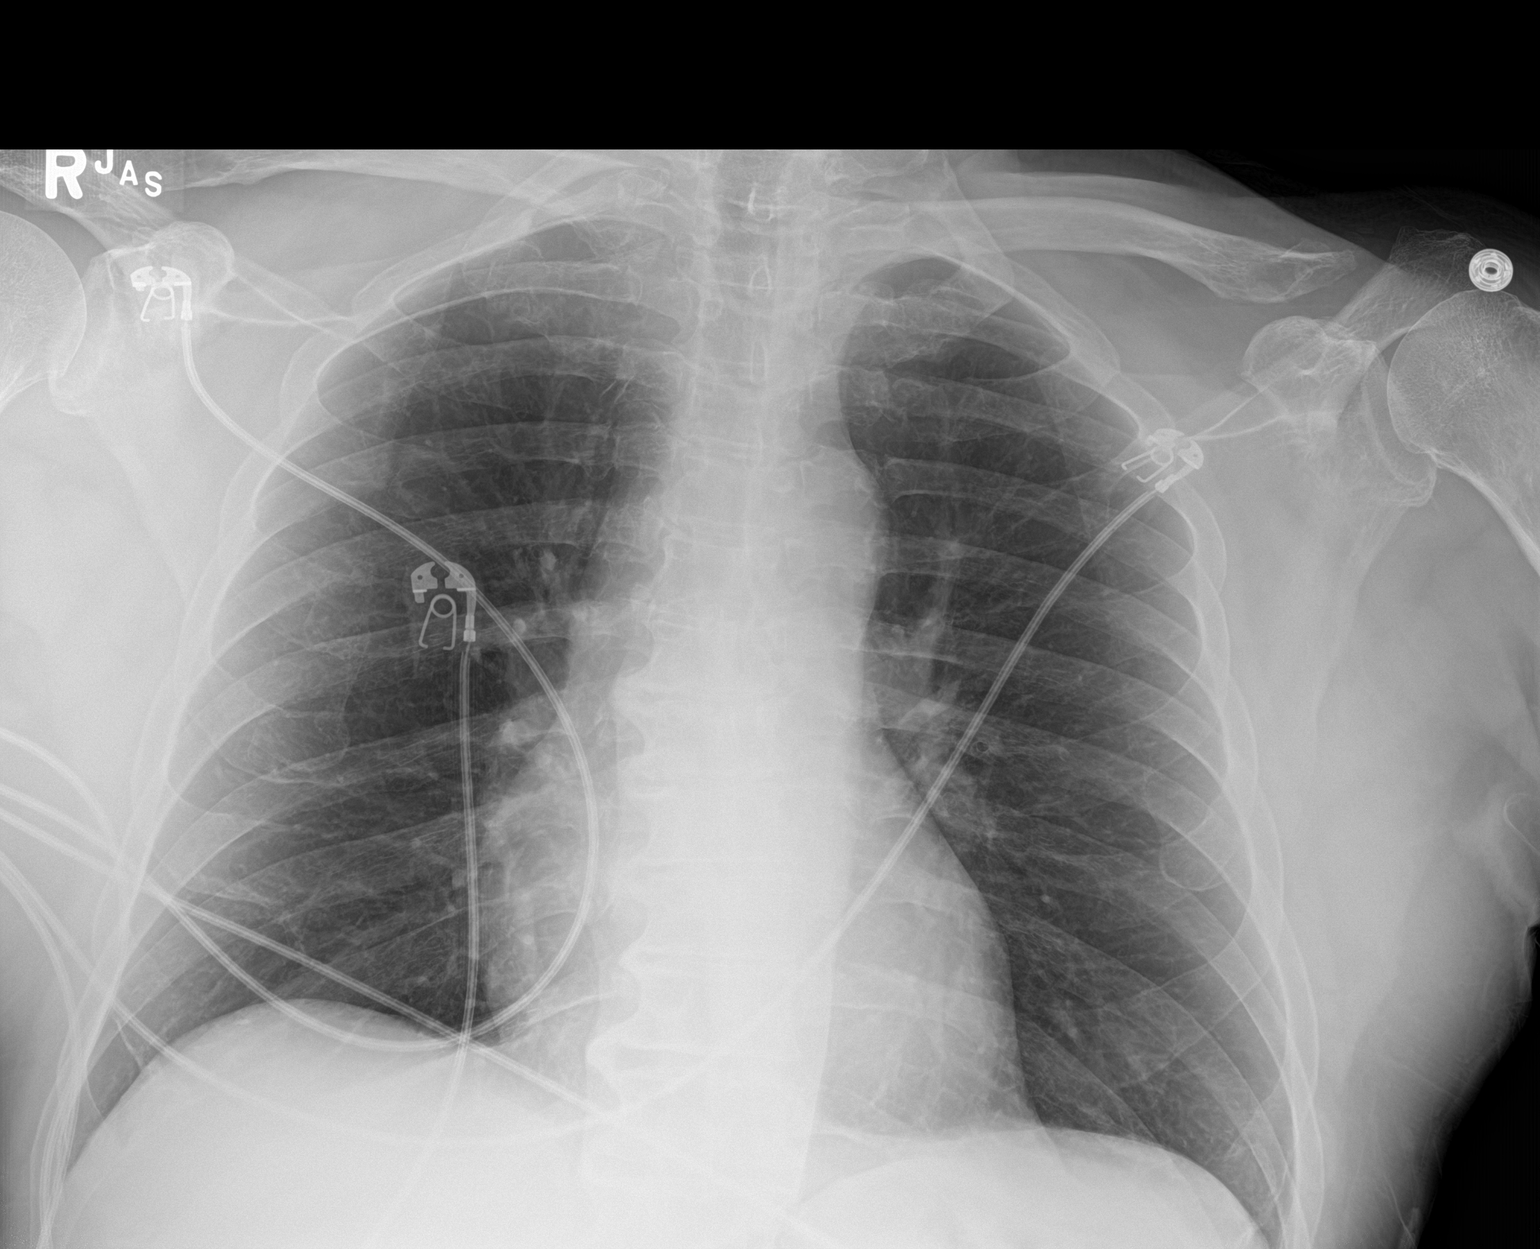

[1 of 1 positions shown; findings below may reference images not displayed]

FINDINGS: Cardiomediastinal silhouette is normal. No pleural effusions or
focal consolidations. Trachea projects midline and there is no
pneumothorax. Soft tissue planes and included osseous structures are
non-suspicious. Foreshortened distal LEFT clavicle may be
postoperative. Thoracic spondylosis.
IMPRESSION: No acute cardiopulmonary process.

## 2019-05-30 ENCOUNTER — Telehealth: Payer: Self-pay | Admitting: Cardiology

## 2019-05-30 NOTE — Telephone Encounter (Signed)
Called pt to confirm his appt for 7--20-20.        COVID-19 Pre-Screening Questions:   In the past 7 to 10 days have you had a cough,  shortness of breath, headache, congestion, fever (100 or greater) body aches, chills, sore throat, or sudden loss of taste or sense of smell? no  Have you been around anyone with known Covid 19.  Have you been around anyone who is awaiting Covid 19 test results in the past 7 to 10 days? no  Have you been around anyone who has been exposed to Covid 19, or has mentioned symptoms of Covid 19 within the past 7 to 10 days? no  If you have any concerns/questions about symptoms patients report during screening (either on the phone or at [facility]). Contact the provider seeing the patient or DOD for further guidance.  If neither are available contact a member of the leadership team.

## 2019-06-02 ENCOUNTER — Telehealth: Payer: Self-pay

## 2019-06-02 ENCOUNTER — Ambulatory Visit: Payer: Federal, State, Local not specified - PPO | Admitting: Cardiology

## 2019-06-02 NOTE — Telephone Encounter (Signed)
Patient walked in for appointment and stated he was at a gathering and his friends grand daughter tested positive for Covid-19. He stated his friends have not been tested so they don't know if they have it or not and he kept his distance at the party.  He states it has been 7 days since they were at the party. I advised patient to self quarantine and we will contact him to reschedule his follow up appt. Patient looked a little upset and walked off.   [facility] notified and agreeable with my decision.   I will call patient about lunch time and reschedule appt.

## 2019-06-02 NOTE — Telephone Encounter (Signed)
Spoke with patient and he stated he called back earlier and rescheduled his follow up appt. I advised him to go by the health dept and get a free covid test also gave pt the address as well. I advised patient to quarantine himself until his app next week. He voiced understanding.

## 2019-06-10 ENCOUNTER — Encounter: Payer: Self-pay | Admitting: Cardiology

## 2019-06-10 ENCOUNTER — Other Ambulatory Visit: Payer: Self-pay

## 2019-06-10 ENCOUNTER — Telehealth: Payer: Self-pay | Admitting: Cardiology

## 2019-06-10 ENCOUNTER — Ambulatory Visit (INDEPENDENT_AMBULATORY_CARE_PROVIDER_SITE_OTHER): Payer: Federal, State, Local not specified - PPO | Admitting: Cardiology

## 2019-06-10 VITALS — BP 130/70 | HR 74 | Temp 99.0°F | Ht 66.0 in | Wt 186.0 lb

## 2019-06-10 DIAGNOSIS — I10 Essential (primary) hypertension: Secondary | ICD-10-CM

## 2019-06-10 DIAGNOSIS — R0609 Other forms of dyspnea: Secondary | ICD-10-CM | POA: Insufficient documentation

## 2019-06-10 DIAGNOSIS — N183 Chronic kidney disease, stage 3 unspecified: Secondary | ICD-10-CM

## 2019-06-10 DIAGNOSIS — Z9861 Coronary angioplasty status: Secondary | ICD-10-CM

## 2019-06-10 DIAGNOSIS — I2510 Atherosclerotic heart disease of native coronary artery without angina pectoris: Secondary | ICD-10-CM | POA: Diagnosis not present

## 2019-06-10 DIAGNOSIS — E119 Type 2 diabetes mellitus without complications: Secondary | ICD-10-CM

## 2019-06-10 DIAGNOSIS — E785 Hyperlipidemia, unspecified: Secondary | ICD-10-CM

## 2019-06-10 DIAGNOSIS — R0600 Dyspnea, unspecified: Secondary | ICD-10-CM | POA: Insufficient documentation

## 2019-06-10 NOTE — Assessment & Plan Note (Addendum)
On Glucotrol.  Consider addition of Empagliflozin- will defer to PCP as Glucotrol may need to be adjusted.

## 2019-06-10 NOTE — Assessment & Plan Note (Signed)
PCI with DES- mid LAD, RCA, and Dx1 10/29/18

## 2019-06-10 NOTE — Telephone Encounter (Signed)
Spoke to patient's wife she stated husband has appointment this afternoon with Kerin Ransom PA.Stated they were around a couple whose grand daughter had suspected covid.They have no symptoms and they had a covid test, but have not received results.Stated they have been quarantined for the past 18 to 20 days.Advised they should be able to keep appointment as planned.

## 2019-06-10 NOTE — Assessment & Plan Note (Signed)
CRI-3. GFR 51 with hyperkalemia

## 2019-06-10 NOTE — Progress Notes (Signed)
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  He has a history of hypertension, dyslipidemia, non-insulin-dependent diabetes, and chronic renal insufficiency stage III.  He is also had remote prostate cancer and cervical spine DJD. He presented December 2019 with a non-ST elevation MI.  he was building a storage area in his garage. Cath revealed three-vessel disease and he underwent three-vessel stenting with a PCI and DES to his RCA, diagonal, and LAD.  His LV function was normal with an EF of 55 to 60%.    He is in the office today for a 6 month follow up. He is working on moving to their house at [place].  He has noticed some exertional fatigue and dyspnea.  He is concerned because his pre PCI symptoms were actually quit mild.  He denies any chest pain.   Past Medical History:  Diagnosis Date  . 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 Basal cell carcinoma 1990s   "chin"  . GERD (gastroesophageal reflux disease)   . High cholesterol   . Hypertension   . NSTEMI (non-ST elevated myocardial infarction) ([place]) 10/29/2018  . Pneumonia    "walking pneumonia a number of times; always in the fall; probably related to allergies; nothing in 4-5 years" (10/29/2018)  . Prostate cancer [facility])    Prostate Cancer- low grade."watchful waiting being observed" -[facility]  . Type II diabetes mellitus ([place])     Past Surgical History:  Procedure Laterality Date  . BASAL CELL CARCINOMA EXCISION     "chin" (10/29/2018)  . COLONOSCOPY W/ POLYPECTOMY     "I've had 3 of these" (10/29/2018)  . COLONOSCOPY WITH PROPOFOL N/A 12/21/2015   Procedure: COLONOSCOPY WITH  PROPOFOL;  Surgeon: Garlan Fair, MD;  Location: [facility];  Service: Endoscopy;  Laterality: N/A;  . CORONARY ANGIOPLASTY WITH STENT PLACEMENT  10/29/2018   "3 stents"  . CORONARY STENT INTERVENTION N/A 10/29/2018   Procedure: CORONARY STENT INTERVENTION;  Surgeon: Burnell Blanks, MD;  Location: [facility];  Service: Cardiovascular;  Laterality: N/A;  . LEFT HEART CATH AND CORONARY ANGIOGRAPHY N/A 10/29/2018   Procedure: LEFT HEART CATH AND CORONARY ANGIOGRAPHY;  Surgeon: Burnell Blanks, MD;  Location: [facility];  Service: Cardiovascular;  Laterality: N/A;  . ORIF FINGER / [place]   "compound break in my thumb; had K wires put in; no retained hardware"  . PROSTATE BIOPSY  X 3  . SHOULDER ARTHROSCOPY W/ ROTATOR CUFF REPAIR Left   . TONSILLECTOMY    . WISDOM TOOTH EXTRACTION Left    "very complicated extraction; 2 teeth"    Current Medications: Current Meds  Medication Sig  . Ascorbic Acid (VITAMIN C) 1000 MG tablet Take 1,000 mg by mouth daily.  Marland Kitchen aspirin EC 81 MG EC tablet Take 1 tablet (81 mg total) by mouth daily.  Marland Kitchen atorvastatin (LIPITOR) 80 MG tablet Take 1 tablet (80 mg total) by mouth daily.  . cholecalciferol (VITAMIN D) 1000 units tablet Take 1,000 Units by mouth daily.  Marland Kitchen glipiZIDE (GLUCOTROL) 5 MG tablet Take 5  mg by mouth daily before breakfast.  . metoprolol tartrate (LOPRESSOR) 25 MG tablet Take 1 tablet (25 mg total) by mouth 2 (two) times daily.  . ticagrelor (BRILINTA) 90 MG TABS tablet Take 1 tablet (90 mg total) by mouth 2 (two) times daily.  . vitamin B-12 (CYANOCOBALAMIN) 1000 MCG tablet Take 1,000 mcg by mouth daily.     Allergies:   Patient has no known allergies.   Social History   Socioeconomic History  . Marital status: Married    Spouse name: Not on file  . Number of children: Not on file  . Years of education: Not on file  . Highest education level: Not on file  Occupational History  . Not  on file  Social Needs  . Financial resource strain: Not on file  . Food insecurity    Worry: Not on file    Inability: Not on file  . Transportation needs    Medical: Not on file    Non-medical: Not on file  Tobacco Use  . Smoking status: Former Smoker    Packs/day: 1.00    Years: 20.00    Pack years: 20.00    Types: Cigarettes    Quit date: 1982    Years since quitting: 38.5  . Smokeless tobacco: Never Used  Substance and Sexual Activity  . Alcohol use: Yes    Alcohol/week: 18.0 standard drinks    Types: 18 Glasses of wine per week    Comment: 10/29/2018 "10oz wine/night"  . Drug use: Never  . Sexual activity: Not Currently  Lifestyle  . Physical activity    Days per week: Not on file    Minutes per session: Not on file  . Stress: Not on file  Relationships  . Social Herbalist on phone: Not on file    Gets together: Not on file    Attends religious service: Not on file    Active member of club or organization: Not on file    Attends meetings of clubs or organizations: Not on file    Relationship status: Not on file  Other Topics Concern  . Not on file  Social History Narrative  . Not on file     Family History: The patient's family history includes CAD (age of onset: 45) in his father.  ROS:   Please see the history of present illness.     All other systems reviewed and are negative.  EKGs/Labs/Other Studies Reviewed:    The following studies were reviewed today:   EKG:  EKG is  ordered today.  The ekg ordered today demonstrates NSR, sinus arythmia  Recent Labs: 10/30/2018: Hemoglobin 12.0; Platelets 223 01/06/2019: ALT 27 02/03/2019: BUN 17; Creatinine, Ser 1.29; Potassium 5.4; Sodium 142  Recent Lipid Panel    Component Value Date/Time   CHOL 121 01/06/2019 0854   TRIG 120 01/06/2019 0854   HDL 40 01/06/2019 0854   CHOLHDL 3.0 01/06/2019 0854   CHOLHDL 2.9 10/29/2018 0527   VLDL 8 10/29/2018 0527   LDLCALC 57 01/06/2019 0854     Physical Exam:    VS:  BP 130/70   Pulse 74   Temp 99 F (37.2 C) (Temporal)   Ht 5\' 6"  (1.676 m)   Wt 186 lb (84.4 kg)   SpO2 96%   BMI 30.02 kg/m     Wt Readings from Last 3 Encounters:  06/10/19 186 lb (84.4 kg)  11/25/18 191 lb (86.6 kg)  10/30/18 187 lb 6.3 oz (  85 kg)     GEN:  Well nourished, well developed in no acute distress HEENT: Normal NECK: No JVD; No carotid bruits LYMPHATICS: No lymphadenopathy CARDIAC: RRR, no murmurs, rubs, gallops RESPIRATORY:  Clear to auscultation without rales, wheezing or rhonchi  ABDOMEN: Soft, non-tender, non-distended MUSCULOSKELETAL:  No edema; No deformity  SKIN: Warm and dry NEUROLOGIC:  Alert and oriented x 3 PSYCHIATRIC:  Normal affect   ASSESSMENT:    DOE (dyspnea on exertion) Pt has had some DOE- his presenting symptoms with his MI were similar. No history of chest pain.  CAD S/P percutaneous coronary angioplasty;  PCI with DES- mid LAD, RCA, and Dx1 10/29/18  CRI (chronic renal insufficiency), stage 3 (moderate) (HCC) CRI-3. GFR 51 with hyperkalemia  Essential hypertension Controlled  Dyslipidemia, goal LDL below 70 LDL 80 on admission in Dec -(on Lipitor 10 mg)- dose increased to 80 mg after his NSTEMI/ PCI LDL 57 Feb 2020  Non-insulin dependent type 2 diabetes mellitus ([place]) On Glucotrol.  Consider addition of Empagliflozin- will defer to PCP as Glucotrol may need to be adjusted.   PLAN:    I ordered a Lexiscan, and BMP (he has h/o hyperkalemia). I'll see him back in 6 months or sooner if needed.  I asked to talk with dr Lysle Rubens about adding Jardiance.    Medication Adjustments/Labs and Tests Ordered: Current medicines are reviewed at length with the patient today.  Concerns regarding medicines are outlined above.  Orders Placed This Encounter  Procedures  . Basic Metabolic Panel (BMET)  . MYOCARDIAL PERFUSION IMAGING  . EKG 12-Lead   No orders of the defined types were placed in this  encounter.   Patient Instructions  Medication Instructions:  Your physician recommends that you continue on your current medications as directed. Please refer to the Current Medication list given to you today.  If you need a refill on your cardiac medications before your next appointment, please call your pharmacy.   Lab work: Your physician recommends that you return for lab work in: TODAY-BMET If you have labs (blood work) drawn today and your tests are completely normal, you will receive your results only by: Marland Kitchen MyChart Message (if you have MyChart) OR . A paper copy in the mail If you have any lab test that is abnormal or we need to change your treatment, we will call you to review the results.  Testing/Procedures: Your physician has requested that you have a lexiscan myoview. For further information please visit [email or web address]. Please follow instruction sheet, as given. NO MEDS TO HOLD  Follow-Up: At [facility], you and your health needs are our priority.  As part of our continuing mission to provide you with exceptional heart care, we have created designated Provider Care Teams.  These Care Teams include your primary Cardiologist (physician) and Advanced Practice Providers (APPs -  Physician Assistants and Nurse Practitioners) who all work together to provide you with the care you need, when you need it.  . Your physician recommends that you return for lab work in: [place], PA-C  Any Other Special Instructions Will Be Listed Below (If Applicable).      Signed, Kerin Ransom, PA-C  06/10/2019 4:40 PM    [facility]

## 2019-06-10 NOTE — Patient Instructions (Signed)
Medication Instructions:  Your physician recommends that you continue on your current medications as directed. Please refer to the Current Medication list given to you today.  If you need a refill on your cardiac medications before your next appointment, please call your pharmacy.   Lab work: Your physician recommends that you return for lab work in: TODAY-BMET If you have labs (blood work) drawn today and your tests are completely normal, you will receive your results only by: Marland Kitchen MyChart Message (if you have MyChart) OR . A paper copy in the mail If you have any lab test that is abnormal or we need to change your treatment, we will call you to review the results.  Testing/Procedures: Your physician has requested that you have a lexiscan myoview. For further information please visit [email or web address]. Please follow instruction sheet, as given. NO MEDS TO HOLD  Follow-Up: At [facility], you and your health needs are our priority.  As part of our continuing mission to provide you with exceptional heart care, we have created designated Provider Care Teams.  These Care Teams include your primary Cardiologist (physician) and Advanced Practice Providers (APPs -  Physician Assistants and Nurse Practitioners) who all work together to provide you with the care you need, when you need it.  . Your physician recommends that you return for lab work in: [place], PA-C  Any Other Special Instructions Will Be Listed Below (If Applicable).

## 2019-06-10 NOTE — Assessment & Plan Note (Signed)
Controlled.  

## 2019-06-10 NOTE — Telephone Encounter (Signed)

## 2019-06-10 NOTE — Assessment & Plan Note (Signed)
Pt has had some DOE- his presenting symptoms with his MI were similar. No history of chest pain.

## 2019-06-10 NOTE — Assessment & Plan Note (Signed)
LDL 80 on admission in Dec -(on Lipitor 10 mg)- dose increased to 80 mg after his NSTEMI/ PCI LDL 57 Feb 2020

## 2019-06-11 ENCOUNTER — Ambulatory Visit: Payer: Federal, State, Local not specified - PPO | Admitting: Cardiology

## 2019-06-11 LAB — BASIC METABOLIC PANEL
BUN/Creatinine Ratio: 13 (ref 10–24)
BUN: 18 mg/dL (ref 8–27)
CO2: 25 mmol/L (ref 20–29)
Calcium: 9.5 mg/dL (ref 8.6–10.2)
Chloride: 108 mmol/L — ABNORMAL HIGH (ref 96–106)
Creatinine, Ser: 1.34 mg/dL — ABNORMAL HIGH (ref 0.76–1.27)
GFR calc Af Amer: 59 mL/min/{1.73_m2} — ABNORMAL LOW (ref >59)
GFR calc non Af Amer: 51 mL/min/{1.73_m2} — ABNORMAL LOW (ref >59)
Glucose: 156 mg/dL — ABNORMAL HIGH (ref 65–99)
Potassium: 5.3 mmol/L — ABNORMAL HIGH (ref 3.5–5.2)
Sodium: 147 mmol/L — ABNORMAL HIGH (ref 134–144)

## 2019-06-24 ENCOUNTER — Telehealth (HOSPITAL_COMMUNITY): Payer: Self-pay

## 2019-06-24 ENCOUNTER — Telehealth: Payer: Self-pay | Admitting: Cardiology

## 2019-06-24 NOTE — Telephone Encounter (Signed)
Encounter complete. 

## 2019-06-24 NOTE — Telephone Encounter (Signed)
David Drake spoke with pt but got disconnected many time but she is done with pt. LMVM

## 2019-06-24 NOTE — Telephone Encounter (Signed)
New message:    Patient calling stating that some one called him and he is returning call back.

## 2019-06-27 ENCOUNTER — Other Ambulatory Visit: Payer: Self-pay

## 2019-06-27 ENCOUNTER — Ambulatory Visit (HOSPITAL_COMMUNITY)
Admission: RE | Admit: 2019-06-27 | Discharge: 2019-06-27 | Disposition: A | Discharge status: Home / Self Care | Payer: Federal, State, Local not specified - PPO | Source: Ambulatory Visit | Attending: Cardiology | Admitting: Cardiology

## 2019-06-27 DIAGNOSIS — E785 Hyperlipidemia, unspecified: Secondary | ICD-10-CM | POA: Insufficient documentation

## 2019-06-27 DIAGNOSIS — N183 Chronic kidney disease, stage 3 (moderate): Secondary | ICD-10-CM | POA: Diagnosis not present

## 2019-06-27 DIAGNOSIS — I10 Essential (primary) hypertension: Secondary | ICD-10-CM | POA: Insufficient documentation

## 2019-06-27 DIAGNOSIS — R0609 Other forms of dyspnea: Secondary | ICD-10-CM

## 2019-06-27 DIAGNOSIS — E119 Type 2 diabetes mellitus without complications: Secondary | ICD-10-CM | POA: Diagnosis present

## 2019-06-27 DIAGNOSIS — I2510 Atherosclerotic heart disease of native coronary artery without angina pectoris: Secondary | ICD-10-CM | POA: Diagnosis not present

## 2019-06-27 DIAGNOSIS — Z9861 Coronary angioplasty status: Secondary | ICD-10-CM | POA: Diagnosis present

## 2019-06-27 LAB — MYOCARDIAL PERFUSION IMAGING
LV dias vol: 85 mL (ref 62–150)
LV sys vol: 38 mL
Peak HR: 90 {beats}/min
Rest HR: 62 {beats}/min
SDS: 1
SRS: 0
SSS: 1
TID: 1.47

## 2019-06-27 MED ORDER — REGADENOSON 0.4 MG/5ML IV SOLN
0.4000 mg | Freq: Once | INTRAVENOUS | Status: AC
  Administered 2019-06-27: 0.4 mg via INTRAVENOUS

## 2019-06-27 MED ORDER — TECHNETIUM TC 99M TETROFOSMIN IV KIT
30.2000 | PACK | Freq: Once | INTRAVENOUS | Status: AC | PRN
  Administered 2019-06-27: 30.2 via INTRAVENOUS
  Filled 2019-06-27: qty 31

## 2019-06-27 MED ORDER — TECHNETIUM TC 99M TETROFOSMIN IV KIT
10.0000 | PACK | Freq: Once | INTRAVENOUS | Status: AC | PRN
  Administered 2019-06-27: 10 via INTRAVENOUS
  Filled 2019-06-27: qty 10

## 2019-07-23 ENCOUNTER — Ambulatory Visit: Payer: Federal, State, Local not specified - PPO | Admitting: Cardiology

## 2019-08-05 ENCOUNTER — Ambulatory Visit (INDEPENDENT_AMBULATORY_CARE_PROVIDER_SITE_OTHER): Payer: Federal, State, Local not specified - PPO | Admitting: Cardiology

## 2019-08-05 ENCOUNTER — Other Ambulatory Visit: Payer: Self-pay

## 2019-08-05 ENCOUNTER — Encounter: Payer: Self-pay | Admitting: Cardiology

## 2019-08-05 VITALS — BP 132/76 | HR 68 | Temp 97.1°F | Ht 66.0 in | Wt 181.0 lb

## 2019-08-05 DIAGNOSIS — E785 Hyperlipidemia, unspecified: Secondary | ICD-10-CM | POA: Diagnosis not present

## 2019-08-05 DIAGNOSIS — Z9861 Coronary angioplasty status: Secondary | ICD-10-CM

## 2019-08-05 DIAGNOSIS — N183 Chronic kidney disease, stage 3 unspecified: Secondary | ICD-10-CM

## 2019-08-05 DIAGNOSIS — I2510 Atherosclerotic heart disease of native coronary artery without angina pectoris: Secondary | ICD-10-CM | POA: Diagnosis not present

## 2019-08-05 DIAGNOSIS — E119 Type 2 diabetes mellitus without complications: Secondary | ICD-10-CM

## 2019-08-05 DIAGNOSIS — I10 Essential (primary) hypertension: Secondary | ICD-10-CM

## 2019-08-05 DIAGNOSIS — I252 Old myocardial infarction: Secondary | ICD-10-CM | POA: Diagnosis not present

## 2019-08-05 LAB — BASIC METABOLIC PANEL
BUN/Creatinine Ratio: 17 (ref 10–24)
BUN: 21 mg/dL (ref 8–27)
CO2: 24 mmol/L (ref 20–29)
Calcium: 9.4 mg/dL (ref 8.6–10.2)
Chloride: 105 mmol/L (ref 96–106)
Creatinine, Ser: 1.21 mg/dL (ref 0.76–1.27)
GFR calc Af Amer: 66 mL/min/{1.73_m2} (ref >59)
GFR calc non Af Amer: 57 mL/min/{1.73_m2} — ABNORMAL LOW (ref >59)
Glucose: 103 mg/dL — ABNORMAL HIGH (ref 65–99)
Potassium: 5.4 mmol/L — ABNORMAL HIGH (ref 3.5–5.2)
Sodium: 143 mmol/L (ref 134–144)

## 2019-08-05 NOTE — Assessment & Plan Note (Signed)
LDL 57 Feb 2020 on Lipitor 80 mg

## 2019-08-05 NOTE — Assessment & Plan Note (Signed)
Controlled.  

## 2019-08-05 NOTE — Assessment & Plan Note (Signed)
He is to discuss Jardiance with his new PCP when he sees him next month.

## 2019-08-05 NOTE — Assessment & Plan Note (Signed)
PCI with DES- mid LAD, RCA, and Dx1 10/29/18 

## 2019-08-05 NOTE — Assessment & Plan Note (Signed)
NSTEMI Dec 17-2019. Troponin pk 0.08

## 2019-08-05 NOTE — Assessment & Plan Note (Signed)
CRI-3. GFR 51- his K+ runs high- will re check.  He should avoid high K+ foods.

## 2019-08-05 NOTE — Patient Instructions (Signed)
Medication Instructions:  Your physician recommends that you continue on your current medications as directed. Please refer to the Current Medication list given to you today. If you need a refill on your cardiac medications before your next appointment, please call your pharmacy.   Lab work: Your physician recommends that you return for lab work in: TODAY-BMET If you have labs (blood work) drawn today and your tests are completely normal, you will receive your results only by: Marland Kitchen MyChart Message (if you have MyChart) OR . A paper copy in the mail If you have any lab test that is abnormal or we need to change your treatment, we will call you to review the results.  Testing/Procedures: NONE   Follow-Up: At [facility], you and your health needs are our priority.  As part of our continuing mission to provide you with exceptional heart care, we have created designated Provider Care Teams.  These Care Teams include your primary Cardiologist (physician) and Advanced Practice Providers (APPs -  Physician Assistants and Nurse Practitioners) who all work together to provide you with the care you need, when you need it. You will need a follow up appointment in 6 months.  Please call our office 2 months in advance to schedule this appointment.  You may see Skeet Latch, MD or one of the following Advanced Practice Providers on your designated Care Team:   Kerin Ransom, PA-C Roby Lofts, [place] . Sande Rives, PA-C  Any Other Special Instructions Will Be Listed Below (If Applicable).

## 2019-08-05 NOTE — Progress Notes (Signed)
Cardiology Office Note:    Date:  08/05/2019   ID:  MCGWIRE GAEBLER, DOB January 21, 1941, MRN [id number]  PCP:  System, Pcp Not In  Cardiologist:  Skeet Latch, MD  Electrophysiologist:  None   Referring MD: Wenda Low, MD   Chief Complaint  Patient presents with  . Follow-up    History of Present Illness:    David Drake is a 78 y.o. 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. He has a history of hypertension, dyslipidemia, non-insulin-dependent diabetes, and chronic renal insufficiency stage III. He is also had remote prostate cancer and cervical spine DJD. Hepresented December 2019 with a non-ST elevation MI. He was building a storage area in his garage when he developed SSCP. Cath revealed three-vessel disease and he underwent three-vessel stenting with a PCI and DES to his mRCA, Dx1, and mLAD. His LV function was normal with an EF of 55 to 60%.   I saw him in follow up in July 2020 and he c/o exertional fatigue and dyspnea.  A Myoview was done 06/27/2019 and was negative.  He has done well since, he is now living at his house near [place].  He did have an allergic reaction to something while spreading mulch at his property and had to go to an Urgent Care.  His symptoms resolved with steroids.   Past Medical History:  Diagnosis Date  . 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 Basal cell carcinoma 1990s   "chin"  . GERD (gastroesophageal reflux disease)   . High cholesterol   . Hypertension   . NSTEMI (non-ST elevated myocardial infarction) ([place]) 10/29/2018  . Pneumonia    "walking pneumonia a number of times; always in the fall; probably related to allergies; nothing in 4-5 years" (10/29/2018)  . Prostate cancer [facility])    Prostate Cancer- low grade."watchful waiting being observed" -[facility]  . Type II diabetes mellitus ([place])     Past Surgical History:  Procedure Laterality Date  . BASAL CELL CARCINOMA EXCISION     "chin"  (10/29/2018)  . COLONOSCOPY W/ POLYPECTOMY     "I've had 3 of these" (10/29/2018)  . COLONOSCOPY WITH PROPOFOL N/A 12/21/2015   Procedure: COLONOSCOPY WITH PROPOFOL;  Surgeon: Garlan Fair, MD;  [facility];  Service: Endoscopy;  Laterality: N/A;  . CORONARY ANGIOPLASTY WITH STENT PLACEMENT  10/29/2018   "3 stents"  . CORONARY STENT INTERVENTION N/A 10/29/2018   Procedure: CORONARY STENT INTERVENTION;  Surgeon: Burnell Blanks, MD;  Location: [place] [facility];  Service: Cardiovascular;  Laterality: N/A;  . LEFT HEART CATH AND CORONARY ANGIOGRAPHY N/A 10/29/2018   Procedure: LEFT HEART CATH AND CORONARY ANGIOGRAPHY;  Surgeon: Burnell Blanks, MD;  Location: [place] [facility];  Service: Cardiovascular;  Laterality: N/A;  . ORIF FINGER / [place]   "compound break in my thumb; had K wires put in; no retained hardware"  . PROSTATE BIOPSY  X 3  . SHOULDER ARTHROSCOPY W/ ROTATOR CUFF REPAIR Left   . TONSILLECTOMY    . WISDOM TOOTH EXTRACTION Left    "very complicated extraction; 2 teeth"    Current Medications: Current Meds  Medication Sig  . Ascorbic Acid (VITAMIN C) 1000 MG tablet Take 1,000 mg by mouth daily.  Marland Kitchen aspirin EC 81 MG EC tablet Take 1 tablet (81 mg total) by mouth daily.  Marland Kitchen atorvastatin (LIPITOR) 80 MG tablet Take 1 tablet (80 mg  total) by mouth daily.  . cholecalciferol (VITAMIN D) 1000 units tablet Take 1,000 Units by mouth daily.  Marland Kitchen glipiZIDE (GLUCOTROL) 5 MG tablet Take 5 mg by mouth every evening.   . metoprolol tartrate (LOPRESSOR) 25 MG tablet Take 1 tablet (25 mg total) by mouth 2 (two) times daily.  . ticagrelor (BRILINTA) 90 MG TABS tablet Take 1 tablet (90 mg total) by mouth 2 (two) times daily.  . vitamin B-12 (CYANOCOBALAMIN) 1000 MCG tablet Take 1,000 mcg by mouth daily.     Allergies:   Patient has no known allergies.   Social History   Socioeconomic History  . Marital status: Married    Spouse name: Not on  file  . Number of children: Not on file  . Years of education: Not on file  . Highest education level: Not on file  Occupational History  . Not on file  Social Needs  . Financial resource strain: Not on file  . Food insecurity    Worry: Not on file    Inability: Not on file  . Transportation needs    Medical: Not on file    Non-medical: Not on file  Tobacco Use  . Smoking status: Former Smoker    Packs/day: 1.00    Years: 20.00    Pack years: 20.00    Types: Cigarettes    Quit date: 1982    Years since quitting: 38.7  . Smokeless tobacco: Never Used  Substance and Sexual Activity  . Alcohol use: Yes    Alcohol/week: 18.0 standard drinks    Types: 18 Glasses of wine per week    Comment: 10/29/2018 "10oz wine/night"  . Drug use: Never  . Sexual activity: Not Currently  Lifestyle  . Physical activity    Days per week: Not on file    Minutes per session: Not on file  . Stress: Not on file  Relationships  . Social Herbalist on phone: Not on file    Gets together: Not on file    Attends religious service: Not on file    Active member of club or organization: Not on file    Attends meetings of clubs or organizations: Not on file    Relationship status: Not on file  Other Topics Concern  . Not on file  Social History Narrative  . Not on file     Family History: The patient's family history includes CAD (age of onset: 52) in his father.  ROS:   Please see the history of present illness.    "strained Lt shoulder"  All other systems reviewed and are negative.  EKGs/Labs/Other Studies Reviewed:    The following studies were reviewed today: Myoview 06/27/2019  Recent Labs: 10/30/2018: Hemoglobin 12.0; Platelets 223 01/06/2019: ALT 27 06/10/2019: BUN 18; Creatinine, Ser 1.34; Potassium 5.3; Sodium 147  Recent Lipid Panel    Component Value Date/Time   CHOL 121 01/06/2019 0854   TRIG 120 01/06/2019 0854   HDL 40 01/06/2019 0854   CHOLHDL 3.0 01/06/2019  0854   CHOLHDL 2.9 10/29/2018 0527   VLDL 8 10/29/2018 0527   LDLCALC 57 01/06/2019 0854    Physical Exam:    VS:  BP 132/76   Pulse 68   Temp (!) 97.1 F (36.2 C) (Temporal)   Ht 5\' 6"  (1.676 m)   Wt 181 lb (82.1 kg)   SpO2 96%   BMI 29.21 kg/m     Wt Readings from Last 3 Encounters:  08/05/19 181  lb (82.1 kg)  06/27/19 186 lb (84.4 kg)  06/10/19 186 lb (84.4 kg)     GEN:  Well nourished, well developed in no acute distress HEENT: Normal NECK: No JVD; No carotid bruits LYMPHATICS: No lymphadenopathy CARDIAC: RRR, no murmurs, rubs, gallops RESPIRATORY:  Clear to auscultation without rales, wheezing or rhonchi  ABDOMEN: Soft, non-tender, non-distended MUSCULOSKELETAL:  No edema; No deformity  SKIN: Warm and dry NEUROLOGIC:  Alert and oriented x 3 PSYCHIATRIC:  Normal affect   ASSESSMENT:    History of non-ST elevation myocardial infarction (NSTEMI) NSTEMI Dec 17-2019. Troponin pk 0.08  CAD S/P percutaneous coronary angioplasty;  PCI with DES- mid LAD, RCA, and Dx1 10/29/18  Essential hypertension Controlled  Non-insulin dependent type 2 diabetes mellitus ([place]) He is to discuss Jardiance with his new PCP when he sees him next month.  CRI (chronic renal insufficiency), stage 3 (moderate) (HCC) CRI-3. GFR 51- his K+ runs high- will re check.  He should avoid high K+ foods.   Dyslipidemia, goal LDL below 70 LDL 57 Feb 2020 on Lipitor 80 mg  PLAN:    Check BMP.  He asked about stopping Brilinta after 12 months. I'll discuss this with Dr Oval Linsey-  Change to ASA and Plavix -vs ASA and low dose Brilinta -vs ASA and low dose Xarelto, - vs ASA only.   F/U Dr Oval Linsey in 6 months.   Medication Adjustments/Labs and Tests Ordered: Current medicines are reviewed at length with the patient today.  Concerns regarding medicines are outlined above.  Orders Placed This Encounter  Procedures  . Basic Metabolic Panel (BMET)   No orders of the defined types were placed  in this encounter.   Patient Instructions  Medication Instructions:  Your physician recommends that you continue on your current medications as directed. Please refer to the Current Medication list given to you today. If you need a refill on your cardiac medications before your next appointment, please call your pharmacy.   Lab work: Your physician recommends that you return for lab work in: TODAY-BMET If you have labs (blood work) drawn today and your tests are completely normal, you will receive your results only by: Marland Kitchen MyChart Message (if you have MyChart) OR . A paper copy in the mail If you have any lab test that is abnormal or we need to change your treatment, we will call you to review the results.  Testing/Procedures: NONE   Follow-Up: At [facility], you and your health needs are our priority.  As part of our continuing mission to provide you with exceptional heart care, we have created designated Provider Care Teams.  These Care Teams include your primary Cardiologist (physician) and Advanced Practice Providers (APPs -  Physician Assistants and Nurse Practitioners) who all work together to provide you with the care you need, when you need it. You will need a follow up appointment in 6 months.  Please call our office 2 months in advance to schedule this appointment.  You may see Skeet Latch, MD or one of the following Advanced Practice Providers on your designated Care Team:   Kerin Ransom, PA-C Roby Lofts, [place] . Sande Rives, PA-C  Any Other Special Instructions Will Be Listed Below (If Applicable).      Angelena Form, PA-C  08/05/2019 12:09 PM    [facility]

## 2019-08-08 ENCOUNTER — Encounter: Payer: Self-pay | Admitting: Cardiology

## 2019-08-08 ENCOUNTER — Telehealth: Payer: Self-pay

## 2019-08-08 DIAGNOSIS — E875 Hyperkalemia: Secondary | ICD-10-CM | POA: Insufficient documentation

## 2019-08-08 NOTE — Telephone Encounter (Signed)
Spoke with patient and he is agreeable with having labs and gave his new pcp info. I will add it to his chart.

## 2019-08-12 ENCOUNTER — Other Ambulatory Visit: Payer: Self-pay

## 2019-08-12 DIAGNOSIS — E875 Hyperkalemia: Secondary | ICD-10-CM

## 2019-10-01 ENCOUNTER — Other Ambulatory Visit: Payer: Self-pay | Admitting: Cardiovascular Disease

## 2019-11-16 ENCOUNTER — Other Ambulatory Visit: Payer: Self-pay | Admitting: Cardiovascular Disease

## 2020-01-28 LAB — ALDOSTERONE + RENIN ACTIVITY W/ RATIO
ALDOS/RENIN RATIO: 7.7 (ref 0.0–30.0)
ALDOSTERONE: 9.4 ng/dL (ref 0.0–30.0)
Renin: 1.215 ng/mL/hr (ref 0.167–5.380)

## 2020-02-10 ENCOUNTER — Encounter: Payer: Self-pay | Admitting: Cardiology

## 2020-02-10 ENCOUNTER — Other Ambulatory Visit: Payer: Self-pay

## 2020-02-10 ENCOUNTER — Ambulatory Visit (INDEPENDENT_AMBULATORY_CARE_PROVIDER_SITE_OTHER): Payer: Federal, State, Local not specified - PPO | Admitting: Cardiology

## 2020-02-10 VITALS — BP 130/79 | HR 64 | Temp 97.7°F | Ht 66.0 in | Wt 186.2 lb

## 2020-02-10 DIAGNOSIS — E785 Hyperlipidemia, unspecified: Secondary | ICD-10-CM

## 2020-02-10 DIAGNOSIS — N183 Chronic kidney disease, stage 3 unspecified: Secondary | ICD-10-CM

## 2020-02-10 DIAGNOSIS — I2510 Atherosclerotic heart disease of native coronary artery without angina pectoris: Secondary | ICD-10-CM

## 2020-02-10 DIAGNOSIS — I252 Old myocardial infarction: Secondary | ICD-10-CM

## 2020-02-10 DIAGNOSIS — I10 Essential (primary) hypertension: Secondary | ICD-10-CM | POA: Diagnosis not present

## 2020-02-10 DIAGNOSIS — Z9861 Coronary angioplasty status: Secondary | ICD-10-CM

## 2020-02-10 DIAGNOSIS — E119 Type 2 diabetes mellitus without complications: Secondary | ICD-10-CM

## 2020-02-10 NOTE — Progress Notes (Signed)
Cardiology Office Note:    Date:  02/10/2020   ID:  RON HAAB, DOB 01/15/1941, MRN [id number]  PCP:  System, Pcp Not In  Cardiologist:  Skeet Latch, MD  Electrophysiologist:  None   Referring MD: No ref. provider found   CC: routine follow up  History of Present Illness:    David Drake is a 79 y.o. 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 He has a history of hypertension, dyslipidemia, non-insulin-dependent diabetes, and chronic renal insufficiency stage III. He is also had remote prostate cancer and cervical spine DJD. Hepresented December 2019 with a NSTEMI.He was building a storage area in his garage when he developed SSCP.Cath revealed three-vessel disease and he underwent three-vessel stenting with a PCI and DES to his mRCA, Dx1, and mLAD. His LV function was normal with an EF of 55 to 60%.  I saw him in follow up in July 2020 and he c/o exertional fatigue and dyspnea.  A Myoview was done 06/27/2019 and was negative.  He has done well since, he is now living at his house near [facility]. but  He does a lot of carpentry and recently obtained a 39's motor home that he is rebuilding.  He does not have any symptoms to suggest angina.  He did have a PCP in [place] but he left and the patient is trying to obtain another.    Past Medical History:  Diagnosis Date  . 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 Basal cell carcinoma 1990s   "chin"  . GERD (gastroesophageal reflux disease)   . High cholesterol   . Hypertension   . NSTEMI (non-ST elevated myocardial infarction) ([place]) 10/29/2018  . Pneumonia    "walking pneumonia a number of times; always in the fall; probably related to allergies; nothing in 4-5 years" (10/29/2018)  . Prostate cancer [facility])    Prostate Cancer- low grade."watchful waiting being observed" -[facility]  . Type II diabetes mellitus ([place])     Past Surgical History:  Procedure Laterality Date  .  BASAL CELL CARCINOMA EXCISION     "chin" (10/29/2018)  . COLONOSCOPY W/ POLYPECTOMY     "I've had 3 of these" (10/29/2018)  . COLONOSCOPY WITH PROPOFOL N/A 12/21/2015   Procedure: COLONOSCOPY WITH PROPOFOL;  Surgeon: Garlan Fair, MD;  [facility];  Service: Endoscopy;  Laterality: N/A;  . CORONARY ANGIOPLASTY WITH STENT PLACEMENT  10/29/2018   "3 stents"  . CORONARY STENT INTERVENTION N/A 10/29/2018   Procedure: CORONARY STENT INTERVENTION;  Surgeon: Burnell Blanks, MD;  Location: [place] [facility];  Service: Cardiovascular;  Laterality: N/A;  . LEFT HEART CATH AND CORONARY ANGIOGRAPHY N/A 10/29/2018   Procedure: LEFT HEART CATH AND CORONARY ANGIOGRAPHY;  Surgeon: Burnell Blanks, MD;  Location: [place] [facility];  Service: Cardiovascular;  Laterality: N/A;  . ORIF FINGER / [place]   "compound break in my thumb; had K wires put in; no retained hardware"  . PROSTATE BIOPSY  X 3  . SHOULDER ARTHROSCOPY W/ ROTATOR CUFF REPAIR Left   . TONSILLECTOMY    . WISDOM TOOTH EXTRACTION Left    "very complicated extraction; 2 teeth"    Current Medications: Current Meds  Medication Sig  . Ascorbic Acid (VITAMIN C) 1000 MG tablet Take 1,000 mg by mouth daily.  Marland Kitchen aspirin EC 81 MG EC tablet Take 1 tablet (81 mg total) by mouth daily.  Marland Kitchen  atorvastatin (LIPITOR) 80 MG tablet TAKE 1 TABLET DAILY  . BRILINTA 90 MG TABS tablet TAKE 1 TABLET TWICE A DAY  . cholecalciferol (VITAMIN D) 1000 units tablet Take 1,000 Units by mouth daily.  . empagliflozin (JARDIANCE) 10 MG TABS tablet Take 10 mg by mouth daily.  Marland Kitchen glipiZIDE (GLUCOTROL) 5 MG tablet Take 5 mg by mouth every evening.   . metoprolol tartrate (LOPRESSOR) 25 MG tablet TAKE 1 TABLET TWICE A DAY  . vitamin B-12 (CYANOCOBALAMIN) 1000 MCG tablet Take 1,000 mcg by mouth daily.     Allergies:   Patient has no known allergies.   Social History   Socioeconomic History  . Marital status: Married     Spouse name: Not on file  . Number of children: Not on file  . Years of education: Not on file  . Highest education level: Not on file  Occupational History  . Not on file  Tobacco Use  . Smoking status: Former Smoker    Packs/day: 1.00    Years: 20.00    Pack years: 20.00    Types: Cigarettes    Quit date: 1982    Years since quitting: 39.2  . Smokeless tobacco: Never Used  Substance and Sexual Activity  . Alcohol use: Yes    Alcohol/week: 18.0 standard drinks    Types: 18 Glasses of wine per week    Comment: 10/29/2018 "10oz wine/night"  . Drug use: Never  . Sexual activity: Not Currently  Other Topics Concern  . Not on file  Social History Narrative  . Not on file   Social Determinants of Health   Financial Resource Strain:   . Difficulty of Paying Living Expenses:   Food Insecurity:   . Worried About Charity fundraiser in the Last Year:   . Arboriculturist in the Last Year:   Transportation Needs:   . Film/video editor (Medical):   Marland Kitchen Lack of Transportation (Non-Medical):   Physical Activity:   . Days of Exercise per Week:   . Minutes of Exercise per Session:   Stress:   . Feeling of Stress :   Social Connections:   . Frequency of Communication with Friends and Family:   . Frequency of Social Gatherings with Friends and Family:   . Attends Religious Services:   . Active Member of Clubs or Organizations:   . Attends Archivist Meetings:   Marland Kitchen Marital Status:      Family History: The patient's family history includes CAD (age of onset: 67) in his father.  ROS:   Please see the history of present illness.     All other systems reviewed and are negative.  EKGs/Labs/Other Studies Reviewed:    The following studies were reviewed today: Myoview Aug 2020  EKG:  EKG is not ordered today.  The ekg ordered 06/02/2019 demonstrates NSR- HR 74  Recent Labs: 08/05/2019: BUN 21; Creatinine, Ser 1.21; Potassium 5.4; Sodium 143  Recent Lipid Panel      Component Value Date/Time   CHOL 121 01/06/2019 0854   TRIG 120 01/06/2019 0854   HDL 40 01/06/2019 0854   CHOLHDL 3.0 01/06/2019 0854   CHOLHDL 2.9 10/29/2018 0527   VLDL 8 10/29/2018 0527   LDLCALC 57 01/06/2019 0854    Physical Exam:    VS:  BP 130/79   Pulse 64   Temp 97.7 F (36.5 C)   Ht 5\' 9"  (1.753 m)   Wt 186 lb 3.2 oz (84.5 kg)  SpO2 97%   BMI 27.50 kg/m     Wt Readings from Last 3 Encounters:  02/10/20 186 lb 3.2 oz (84.5 kg)  08/05/19 181 lb (82.1 kg)  06/27/19 186 lb (84.4 kg)     GEN:  Well nourished, well developed in no acute distress HEENT: Normal NECK: No JVD; No carotid bruits CARDIAC: RRR, no murmurs, rubs, gallops RESPIRATORY:  Clear to auscultation without rales, wheezing or rhonchi  ABDOMEN: Soft, non-tender, non-distended MUSCULOSKELETAL:  No edema; No deformity  SKIN: Warm and dry NEUROLOGIC:  Alert and oriented x 3 PSYCHIATRIC:  Normal affect   ASSESSMENT:    History of non-ST elevation myocardial infarction (NSTEMI) NSTEMI Dec 17-2019. Troponin pk 0.08  CAD S/P percutaneous coronary angioplasty;  PCI with DES- mid LAD, RCA, and Dx1 10/29/18  Essential hypertension Controlled  Non-insulin dependent type 2 diabetes mellitus ([place]) He is to discuss Jardiance with his new PCP when he sees him next month.  CRI (chronic renal insufficiency), stage 3 (moderate) (HCC) CRI-3. GFR 51- his K+ runs high- this is chronic- he has normal aldosterone levels and is not on an ACE or K+ supplement.  He should avoid high K+ foods.   Dyslipidemia, goal LDL below 70 LDL 57 Feb 2020 on Lipitor 80 mg   PLAN:    He asked again about stopping Brilinta since its been > 12 months since his PCI/DES.  I'll ask Dr Oval Linsey about this. F/U in 8 months with Dr Oval Linsey.    Medication Adjustments/Labs and Tests Ordered: Current medicines are reviewed at length with the patient today.  Concerns regarding medicines are outlined above.  No orders of the  defined types were placed in this encounter.  No orders of the defined types were placed in this encounter.   Patient Instructions  Medication Instructions:  The current medical regimen is effective;  continue present plan and medications as directed. Please refer to the Current Medication list given to you today. *If you need a refill on your cardiac medications before your next appointment, please call your pharmacy*  Follow-Up: Your next appointment:  8 month(s)  Either In Person or Virtual with Skeet Latch, MD.  At [facility], you and your health needs are our priority.  As part of our continuing mission to provide you with exceptional heart care, we have created designated Provider Care Teams.  These Care Teams include your primary Cardiologist (physician) and Advanced Practice Providers (APPs -  Physician Assistants and Nurse Practitioners) who all work together to provide you with the care you need, when you need it.  We recommend signing up for the patient portal called "MyChart".  Sign up information is provided on this After Visit Summary.  MyChart is used to connect with patients for Virtual Visits (Telemedicine).  Patients are able to view lab/test results, encounter notes, upcoming appointments, etc.  Non-urgent messages can be sent to your provider as well.   To learn more about what you can do with MyChart, go to [email or web address].        Angelena Form, PA-C  02/10/2020 4:47 PM    [facility]

## 2020-02-10 NOTE — Patient Instructions (Signed)
Medication Instructions:  The current medical regimen is effective;  continue present plan and medications as directed. Please refer to the Current Medication list given to you today. *If you need a refill on your cardiac medications before your next appointment, please call your pharmacy*  Follow-Up: Your next appointment:  8 month(s)  Either In Person or Virtual with Skeet Latch, MD.  At [facility], you and your health needs are our priority.  As part of our continuing mission to provide you with exceptional heart care, we have created designated Provider Care Teams.  These Care Teams include your primary Cardiologist (physician) and Advanced Practice Providers (APPs -  Physician Assistants and Nurse Practitioners) who all work together to provide you with the care you need, when you need it.  We recommend signing up for the patient portal called "MyChart".  Sign up information is provided on this After Visit Summary.  MyChart is used to connect with patients for Virtual Visits (Telemedicine).  Patients are able to view lab/test results, encounter notes, upcoming appointments, etc.  Non-urgent messages can be sent to your provider as well.   To learn more about what you can do with MyChart, go to [email or web address].

## 2020-02-16 ENCOUNTER — Telehealth: Payer: Self-pay | Admitting: *Deleted

## 2020-02-16 NOTE — Telephone Encounter (Signed)
Spoke to patient, aware ok to stop Brilinta and continue ASA 81 mg daily.  Med list updated

## 2020-02-16 NOTE — Telephone Encounter (Signed)
-----   Message from Erlene Quan, [place] sent at 02/11/2020  2:54 PM EDT ----- Please let the patient know I reviewed Brilinta with her.  She feels its OK to stop Bilinta at this time- continue ASA 81 mg.  Kerin Ransom PA-C 02/11/2020 2:55 PM   ----- Message ----- From: Skeet Latch, MD Sent: 02/11/2020  11:16 AM EDT To: Erlene Quan, PA-C  OK with me.  Thanks.  ~Tiffany ----- Message ----- From: Erlene Quan, PA-C Sent: 02/10/2020   5:00 PM EDT To: Skeet Latch, MD  He asked about stopping Brilinta since he's out> 12 months since his MI-PCI.

## 2020-11-02 ENCOUNTER — Ambulatory Visit: Payer: Federal, State, Local not specified - PPO | Admitting: Cardiovascular Disease

## 2020-11-02 ENCOUNTER — Encounter: Payer: Self-pay | Admitting: Cardiovascular Disease

## 2020-11-02 ENCOUNTER — Other Ambulatory Visit: Payer: Self-pay

## 2020-11-02 VITALS — BP 130/80 | HR 58 | Ht 66.0 in | Wt 191.4 lb

## 2020-11-02 DIAGNOSIS — I10 Essential (primary) hypertension: Secondary | ICD-10-CM | POA: Diagnosis not present

## 2020-11-02 DIAGNOSIS — I2510 Atherosclerotic heart disease of native coronary artery without angina pectoris: Secondary | ICD-10-CM | POA: Diagnosis not present

## 2020-11-02 DIAGNOSIS — N183 Chronic kidney disease, stage 3 unspecified: Secondary | ICD-10-CM | POA: Diagnosis not present

## 2020-11-02 DIAGNOSIS — Z9861 Coronary angioplasty status: Secondary | ICD-10-CM

## 2020-11-02 DIAGNOSIS — E785 Hyperlipidemia, unspecified: Secondary | ICD-10-CM | POA: Diagnosis not present

## 2020-11-02 NOTE — Patient Instructions (Signed)
Medication Instructions:  Your physician recommends that you continue on your current medications as directed. Please refer to the Current Medication list given to you today.   *If you need a refill on your cardiac medications before your next appointment, please call your pharmacy*  Lab Work: NONE   Testing/Procedures: NONE  Follow-Up: At [facility], you and your health needs are our priority.  As part of our continuing mission to provide you with exceptional heart care, we have created designated Provider Care Teams.  These Care Teams include your primary Cardiologist (physician) and Advanced Practice Providers (APPs -  Physician Assistants and Nurse Practitioners) who all work together to provide you with the care you need, when you need it.  We recommend signing up for the patient portal called "MyChart".  Sign up information is provided on this After Visit Summary.  MyChart is used to connect with patients for Virtual Visits (Telemedicine).  Patients are able to view lab/test results, encounter notes, upcoming appointments, etc.  Non-urgent messages can be sent to your provider as well.   To learn more about what you can do with MyChart, go to [email or web address]://www.mychart.com.    Your next appointment:   12 month(s) You will receive a reminder letter in the mail two months in advance. If you don't receive a letter, please call our office to schedule the follow-up appointment.   The format for your next appointment:   In Person  Provider:   You may see Tiffany Benton, MD or one of the following Advanced Practice Providers on your designated Care Team:    Luke Kilroy, PA-C  Callie Goodrich, PA-C  Jesse Cleaver, FNP    

## 2020-11-02 NOTE — Progress Notes (Signed)
Cardiology Office Note   Date:  11/02/2020   ID:  SARKIS RHINES, DOB 1941-05-21, MRN [id number]  PCP:  Pcp, No  Cardiologist:   Skeet Latch, MD   No chief complaint on file.    History of Present Illness: David Drake is a 79 y.o. male with CAD status post NSTEMI, hypertension, hyperlipidemia, diabetes, prior prostate cancer, and CKD 3 who presents for follow-up.  He was first seen in the hospital 10/2019 19 with an NSTEMI.  Cardiac catheterization revealed three-vessel CAD and he underwent PCI with drug-eluting stent to the mid RCA, D1, and mid LAD.  Echo at that time revealed LVEF 55 to 60%.  He last saw Kerin Ransom, [place] on 01/2020 and was doing well.  He continues to feel well.  His PSA increased from 4 to 6.  He is scheduled to have a bladder MRI next week for surveillance of the prostate.  His urologist recommends stopping Jardiace due to concern for bladder irritation.  He brings a log of his vitals showing that his BP averages 125/75.  He isn't getting much exercise.  He has been rebuilding a 1975 [place].  He isn't otherwise getting much exericse.  He has no exertional chest pain or shortness of breath. He denies LE edema, orthopnea or PND.     Past Medical History:  Diagnosis Date  . 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 Basal cell carcinoma 1990s   "chin"  . GERD (gastroesophageal reflux disease)   . High cholesterol   . Hypertension   . NSTEMI (non-ST elevated myocardial infarction) ([place]) 10/29/2018  . Pneumonia    "walking pneumonia a number of times; always in the fall; probably related to allergies; nothing in 4-5 years" (10/29/2018)  . Prostate cancer [facility])    Prostate Cancer- low grade."watchful waiting being observed" -[facility]  . Type II diabetes mellitus ([place])     Past Surgical History:  Procedure Laterality Date  . BASAL CELL CARCINOMA EXCISION     "chin" (10/29/2018)  . COLONOSCOPY W/ POLYPECTOMY     "I've had 3 of these" (10/29/2018)   . COLONOSCOPY WITH PROPOFOL N/A 12/21/2015   Procedure: COLONOSCOPY WITH PROPOFOL;  Surgeon: Garlan Fair, MD;  Location: [facility];  Service: Endoscopy;  Laterality: N/A;  . CORONARY ANGIOPLASTY WITH STENT PLACEMENT  10/29/2018   "3 stents"  . CORONARY STENT INTERVENTION N/A 10/29/2018   Procedure: CORONARY STENT INTERVENTION;  Surgeon: Burnell Blanks, MD;  Location: [place] [facility];  Service: Cardiovascular;  Laterality: N/A;  . LEFT HEART CATH AND CORONARY ANGIOGRAPHY N/A 10/29/2018   Procedure: LEFT HEART CATH AND CORONARY ANGIOGRAPHY;  Surgeon: Burnell Blanks, MD;  Location: [place] [facility];  Service: Cardiovascular;  Laterality: N/A;  . ORIF FINGER / [place]   "compound break in my thumb; had K wires put in; no retained hardware"  . PROSTATE BIOPSY  X 3  . SHOULDER ARTHROSCOPY W/ ROTATOR CUFF REPAIR Left   . TONSILLECTOMY    . WISDOM TOOTH EXTRACTION Left    "very complicated extraction; 2 teeth"     Current Outpatient Medications  Medication Sig Dispense Refill  . Ascorbic Acid (VITAMIN C) 1000 MG tablet Take 1,000 mg by mouth daily.    Marland Kitchen aspirin EC 81 MG EC tablet Take 1 tablet (81 mg total) by mouth daily.    Marland Kitchen atorvastatin (LIPITOR) 80 MG tablet TAKE 1 TABLET DAILY 90 tablet 3  .  cholecalciferol (VITAMIN D) 1000 units tablet Take 1,000 Units by mouth daily.    Marland Kitchen glipiZIDE (GLUCOTROL) 5 MG tablet Take 5 mg by mouth 2 (two) times daily before a meal.     . metoprolol tartrate (LOPRESSOR) 25 MG tablet TAKE 1 TABLET TWICE A DAY 180 tablet 3  . vitamin B-12 (CYANOCOBALAMIN) 1000 MCG tablet Take 1,000 mcg by mouth daily.     No current facility-administered medications for this visit.    Allergies:   Patient has no known allergies.    Social History:  The patient  reports that he quit smoking about 39 years ago. His smoking use included cigarettes. He has a 20.00 pack-year smoking history. He has never used smokeless tobacco. He  reports current alcohol use of about 18.0 standard drinks of alcohol per week. He reports that he does not use drugs.   Family History:  The patient's family history includes CAD (age of onset: 86) in his father.   ROS:  Please see the history of present illness.   Otherwise, review of systems are positive for none.   All other systems are reviewed and negative.   PHYSICAL EXAM: VS:  BP 130/80   Pulse (!) 58   Ht 5\' 6"  (1.676 m)   Wt 191 lb 6.4 oz (86.8 kg)   BMI 30.89 kg/m  , BMI Body mass index is 30.89 kg/m. GENERAL:  Well appearing HEENT:  Pupils equal round and reactive, fundi not visualized, oral mucosa unremarkable NECK:  No jugular venous distention, waveform within normal limits, carotid upstroke brisk and symmetric, no bruits LUNGS:  Clear to auscultation bilaterally HEART:  RRR.  PMI not displaced or sustained,S1 and S2 within normal limits, no S3, no S4, no clicks, no rubs, no murmurs ABD:  Flat, positive bowel sounds normal in frequency in pitch, no bruits, no rebound, no guarding, no midline pulsatile mass, no hepatomegaly, no splenomegaly EXT:  2 plus pulses throughout, no edema, no cyanosis no clubbing SKIN:  No rashes no nodules NEURO:  Cranial nerves II through XII grossly intact, motor grossly intact throughout PSYCH:  Cognitively intact, oriented to person place and time   EKG:  EKG is ordered today. The ekg ordered today demonstrates sinus bradycardia.  Rate 58 bpm.  PACs.  Incomplete RBBB.     Recent Labs: No results found for requested labs within last 8760 hours.    Lipid Panel    Component Value Date/Time   CHOL 121 01/06/2019 0854   TRIG 120 01/06/2019 0854   HDL 40 01/06/2019 0854   CHOLHDL 3.0 01/06/2019 0854   CHOLHDL 2.9 10/29/2018 0527   VLDL 8 10/29/2018 0527   LDLCALC 57 01/06/2019 0854      Wt Readings from Last 3 Encounters:  11/02/20 191 lb 6.4 oz (86.8 kg)  02/10/20 186 lb 3.2 oz (84.5 kg)  08/05/19 181 lb (82.1 kg)       ASSESSMENT AND PLAN:  # CAD s/p PCI: Stable.  He has no angina.  Continue aspirin, metoprolol, and atorvastatin.  # Hyperlipidemia:  Lipids are well-controlled.  He had lipids with his PCP.  He will have them sent to [place].  # DM:  Mr. Fini needs to stop Jardiance per his urologist.  We will take it off his list.  He will follow up with his PCP for glucose management.   Current medicines are reviewed at length with the patient today.  The patient does not have concerns regarding medicines.  The following changes  have been made:  no change  Labs/ tests ordered today include:   Orders Placed This Encounter  Procedures  . EKG 12-Lead     Disposition:   FU with Makaylin Carlo C. Oval Linsey, MD, [facility] in 1 year.     Signed, Aashritha Miedema C. Oval Linsey, MD, [facility]  11/02/2020 5:18 PM    [facility]

## 2020-12-25 ENCOUNTER — Other Ambulatory Visit: Payer: Self-pay | Admitting: Cardiovascular Disease

## 2021-01-17 ENCOUNTER — Other Ambulatory Visit: Payer: Self-pay | Admitting: Cardiology

## 2021-09-01 ENCOUNTER — Telehealth: Payer: Self-pay | Admitting: Cardiovascular Disease

## 2021-09-01 NOTE — Telephone Encounter (Signed)
New Message:     Patient have moved, he would like Dr Oval Linsey to recommend and refer him to a Cardiologist in [place] or [facility] please.

## 2021-09-06 NOTE — Telephone Encounter (Signed)
Dr. Willene Hatchet at [facility] in [place].  That entire group is excellent, but I know him personally and he is a very smart doctor.     TCR    Advised patient, verbalized understanding

## 2021-09-06 NOTE — Telephone Encounter (Signed)
Left detailed message for patient with the information from Dr. Oval Linsey and sent it in a MyChart message as well. Advised patient may call back with additional questions.

## 2021-09-21 ENCOUNTER — Telehealth: Payer: Self-pay | Admitting: Cardiovascular Disease

## 2021-09-21 ENCOUNTER — Encounter (HOSPITAL_BASED_OUTPATIENT_CLINIC_OR_DEPARTMENT_OTHER): Payer: Self-pay

## 2021-09-22 ENCOUNTER — Other Ambulatory Visit: Payer: Self-pay | Admitting: *Deleted

## 2021-09-22 DIAGNOSIS — I252 Old myocardial infarction: Secondary | ICD-10-CM

## 2021-09-22 DIAGNOSIS — I2510 Atherosclerotic heart disease of native coronary artery without angina pectoris: Secondary | ICD-10-CM

## 2021-09-22 DIAGNOSIS — E785 Hyperlipidemia, unspecified: Secondary | ICD-10-CM

## 2021-09-22 DIAGNOSIS — I10 Essential (primary) hypertension: Secondary | ICD-10-CM

## 2021-09-22 DIAGNOSIS — Z9861 Coronary angioplasty status: Secondary | ICD-10-CM

## 2021-11-13 ENCOUNTER — Other Ambulatory Visit: Payer: Self-pay | Admitting: Cardiovascular Disease

## 2021-11-15 NOTE — Telephone Encounter (Signed)
Rx request sent to pharmacy. Pt needs appointment.

## 2022-02-03 ENCOUNTER — Other Ambulatory Visit: Payer: Self-pay | Admitting: Cardiovascular Disease

## 2022-04-04 ENCOUNTER — Other Ambulatory Visit: Payer: Self-pay | Admitting: Cardiovascular Disease

## 2022-04-05 NOTE — Telephone Encounter (Signed)
Rx(s) sent to pharmacy electronically.  

## 2022-05-09 ENCOUNTER — Other Ambulatory Visit: Payer: Self-pay | Admitting: Cardiovascular Disease
